# Patient Record
Sex: Female | Born: 1975 | Race: White | Hispanic: No | Marital: Married | Smoking: Never smoker
Health system: Southern US, Community
[De-identification: ages and names within clinical notes are randomized; demographics above are authoritative.]

## PROBLEM LIST (undated history)

## (undated) HISTORY — PX: ANKLE SURGERY: SHX546

---

## 2004-04-09 ENCOUNTER — Ambulatory Visit (HOSPITAL_COMMUNITY): Admission: RE | Admit: 2004-04-09 | Discharge: 2004-04-09 | Payer: Self-pay | Admitting: Obstetrics and Gynecology

## 2004-07-08 ENCOUNTER — Inpatient Hospital Stay (HOSPITAL_COMMUNITY): Admission: AD | Admit: 2004-07-08 | Discharge: 2004-07-11 | Payer: Self-pay | Admitting: Obstetrics and Gynecology

## 2004-08-13 ENCOUNTER — Other Ambulatory Visit: Admission: RE | Admit: 2004-08-13 | Discharge: 2004-08-13 | Payer: Self-pay | Admitting: Obstetrics and Gynecology

## 2008-04-25 ENCOUNTER — Inpatient Hospital Stay (HOSPITAL_COMMUNITY): Admission: AD | Admit: 2008-04-25 | Discharge: 2008-04-25 | Payer: Self-pay | Admitting: Obstetrics and Gynecology

## 2008-04-25 IMAGING — US US FETAL BPP W/O NONSTRESS
1 series · 12 of 12 positions shown · non-contrast
Comparison: none

OBSTETRICAL ULTRASOUND:
 This ultrasound exam was performed in the [HOSPITAL] Ultrasound Department.  The OB US report was generated in the AS system, and faxed to the ordering physician.  This report is also available in [REDACTED] PACS.

[Series 1: us fetal bpp w/o nonstress · 0.28mm/px · 12 acquisitions, 12 frames shown]
[im 1/12]
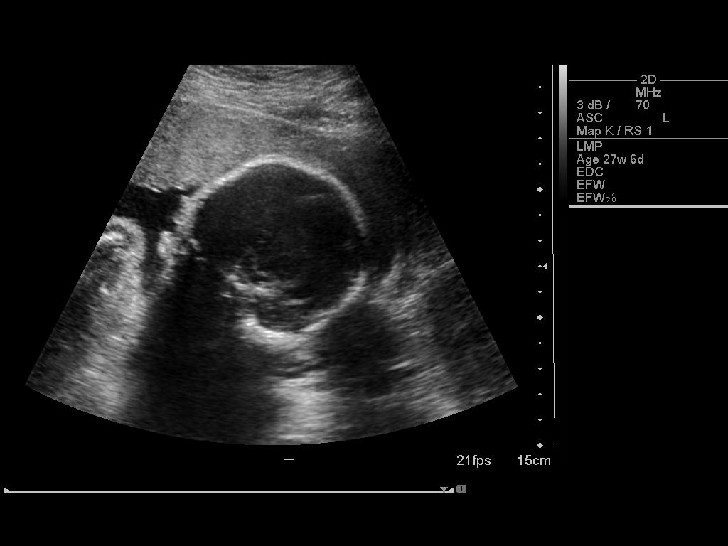
[im 2/12]
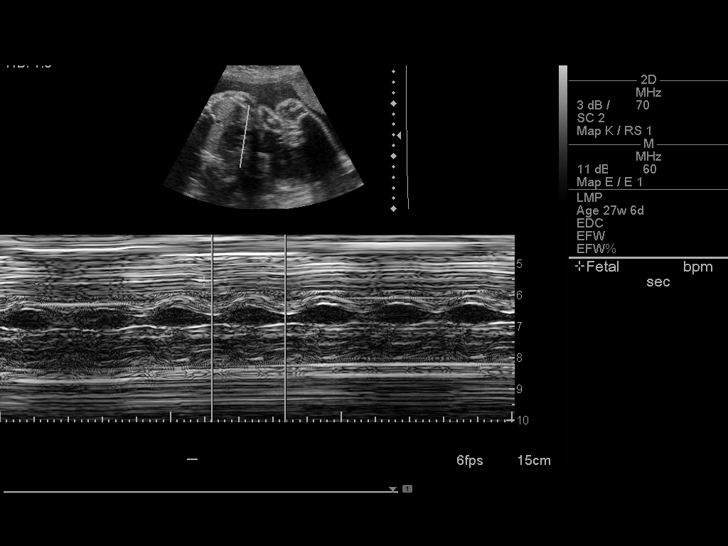
[im 3/12]
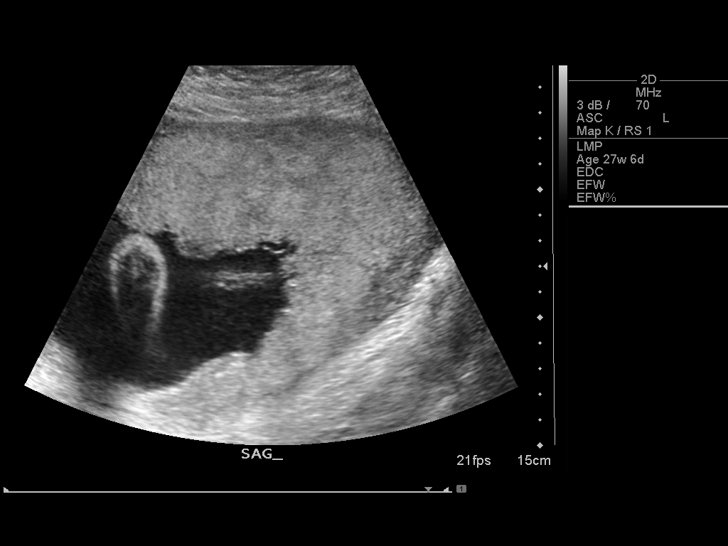
[im 4/12]
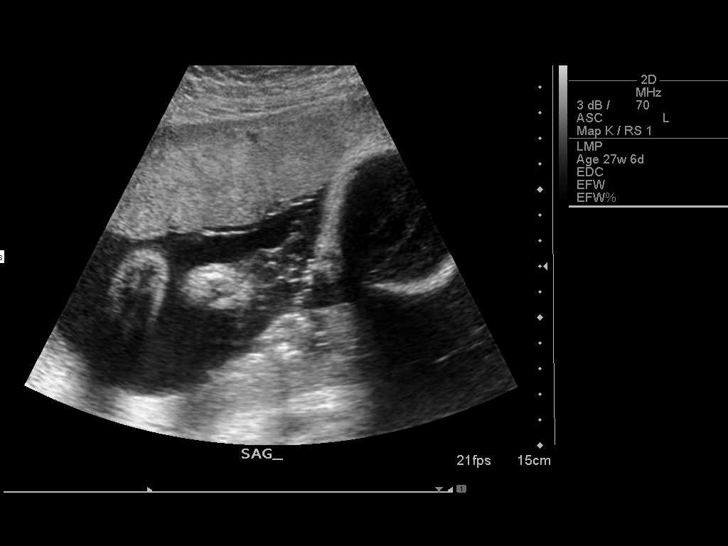
[im 5/12]
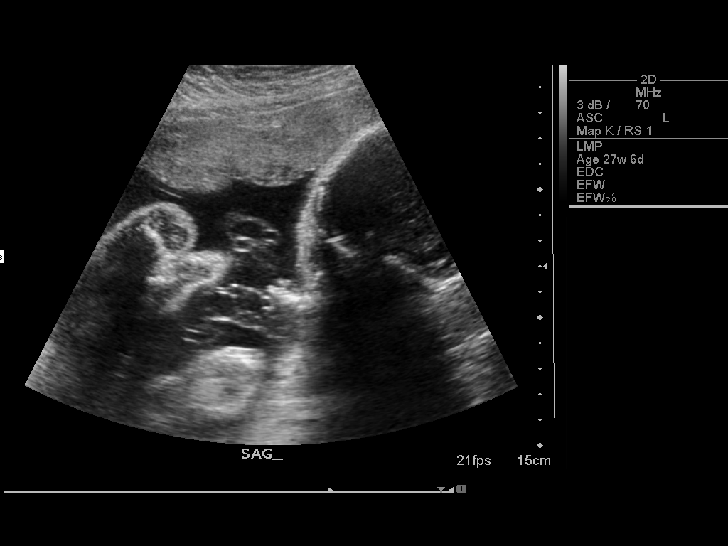
[im 6/12]
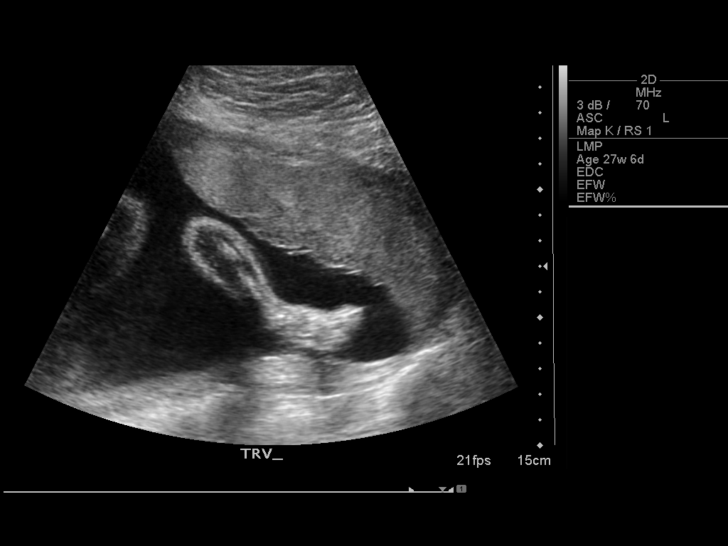
[im 7/12]
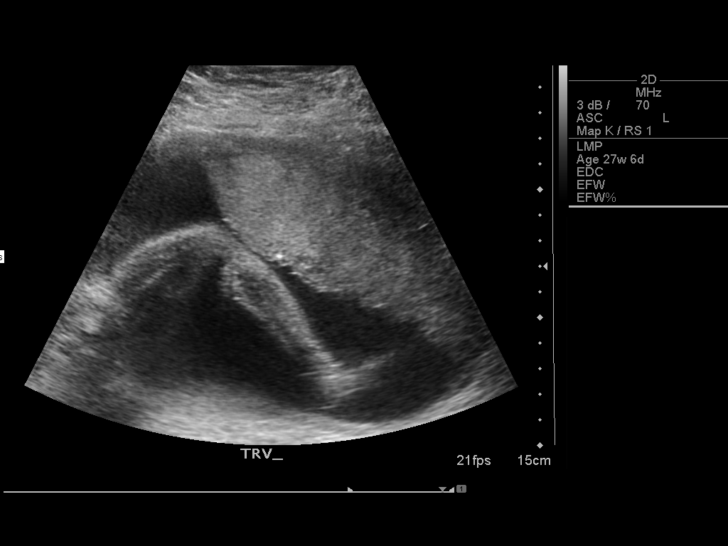
[im 8/12]
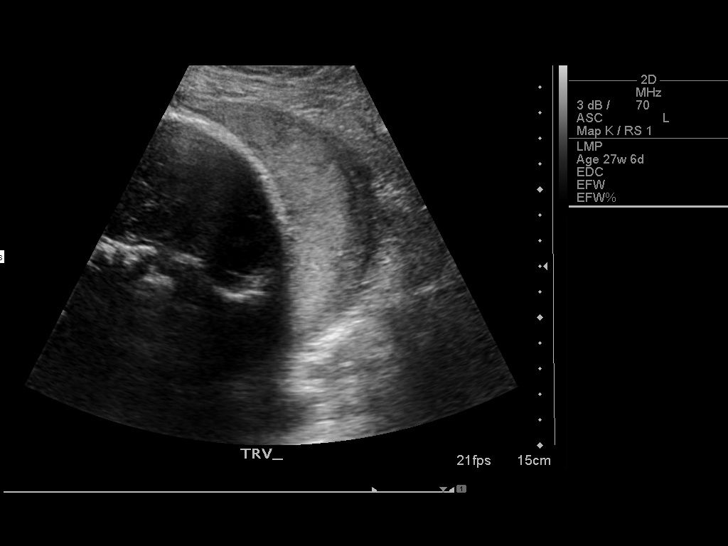
[im 9/12]
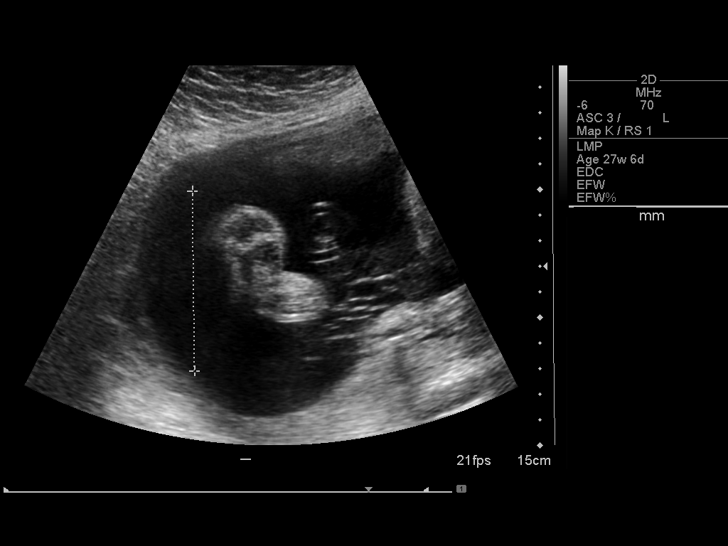
[im 10/12]
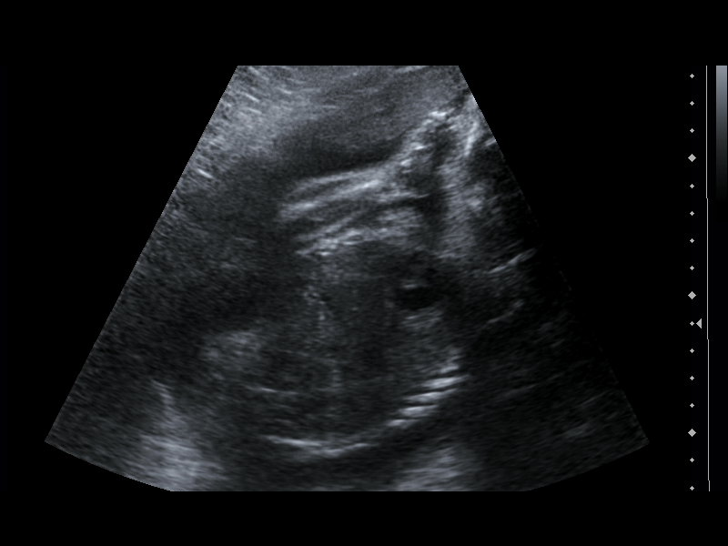
[im 11/12]
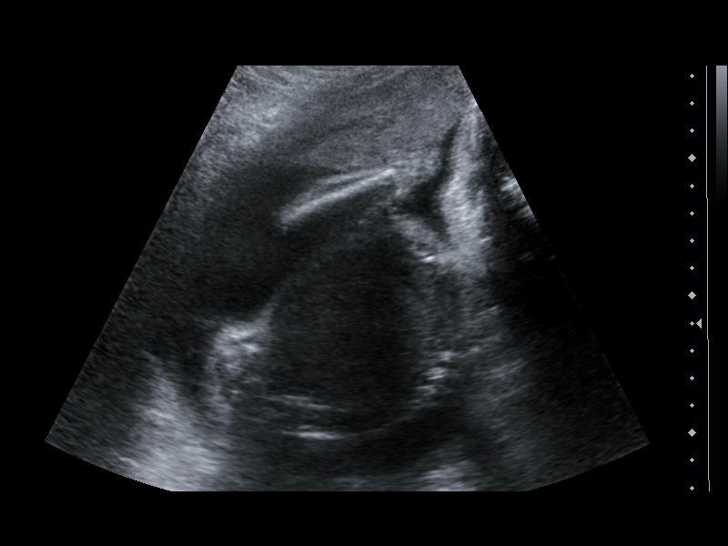
[im 12/12]
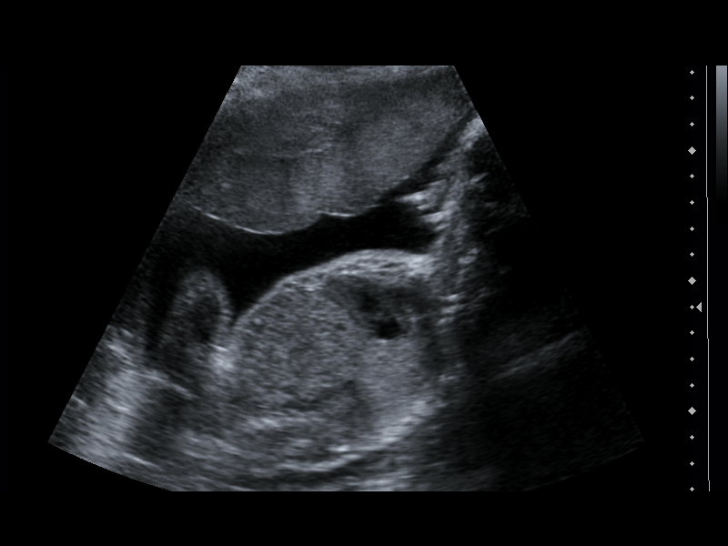

[12 of 12 positions shown; findings below may reference images not displayed]

IMPRESSION: See AS Obstetric US report.

## 2008-07-21 ENCOUNTER — Inpatient Hospital Stay (HOSPITAL_COMMUNITY): Admission: AD | Admit: 2008-07-21 | Discharge: 2008-07-23 | Payer: Self-pay | Admitting: Obstetrics and Gynecology

## 2008-07-21 ENCOUNTER — Encounter (INDEPENDENT_AMBULATORY_CARE_PROVIDER_SITE_OTHER): Payer: Self-pay | Admitting: Obstetrics and Gynecology

## 2011-04-26 NOTE — H&P (Signed)
NAMEROSAMARIA, Paige Roberts              ACCOUNT NO.:  [id number]   MEDICAL RECORD NO.:  [id number]          PATIENT TYPE:  INP   LOCATION:  9166                          FACILITY:  [facility]   PHYSICIAN:  Guy Sandifer. Henderson Cloud, M.D. DATE OF BIRTH:  06-Sep-1976   DATE OF ADMISSION:  07/21/2008  DATE OF DISCHARGE:                              HISTORY & PHYSICAL   CHIEF COMPLAINT:  Uterine contractions.   HISTORY OF PRESENT ILLNESS:  This patient is a 35 year old married white  female G3, P1, with an EDC of July 18, 2008, established by first-  trimester ultrasound, who is now 40-3/7 weeks.  Nonstress test today in  the office revealed three contractions and three decelerations with  those contractions.  There were accelerations as well.  Cervix is 2 cm  dilated, 60% effaced, soft, -2 in vertex.  She denies leaking of water,  vaginal bleeding, headache, vision changes, or epigastric pain.   PAST MEDICAL HISTORY/PAST SURGICAL HISTORY/FAMILY HISTORY/FAMILY  HISTORY/OBSTETRIC HISTORY/SOCIAL HISTORY:  See prenatal history and  physical.   MEDICATIONS:  Prenatal vitamins.   ALLERGIES:  PENICILLIN.   PHYSICAL EXAMINATION:  VITAL SIGNS:  Height 5 feet 6 inches, weight  164.8 pounds, blood pressure 122/78.  LUNGS:  Clear to auscultation.  HEART:  Regular rate and rhythm.  ABDOMEN:  Gravid.  Epigastrium nontender.  Cervix is 2 cm dilated, 60%  effaced, -2 station vertex.  EXTREMITIES:  Grossly within normal limits.  NEUROLOGIC:  Grossly within normal limits.   ASSESSMENT:  1. Intrauterine pregnancy at 40-3/7 weeks.  2. Fetal decelerations on office nonstress test.   PLAN:  Will admit to the hospital.  Continuous fetal monitoring, IV  fluids.  Anticipate probable artificial rupture of membranes.      Guy Sandifer Henderson Cloud, M.D.  Electronically Signed     JET/MEDQ  D:  07/21/2008  T:  07/21/2008  Job:  [id number]

## 2011-09-07 LAB — KLEIHAUER-BETKE STAIN
# Vials RhIg: 1
Quantitation Fetal Hemoglobin: 5

## 2011-09-07 LAB — CBC
HCT: 35.2 — ABNORMAL LOW
Platelets: 206
WBC: 6.8

## 2011-09-09 LAB — CBC
HCT: 35 — ABNORMAL LOW
Hemoglobin: 10.9 — ABNORMAL LOW
MCHC: 33.2
Platelets: 200
RBC: 3.69 — ABNORMAL LOW
WBC: 10.7 — ABNORMAL HIGH
WBC: 7.7

## 2011-09-09 LAB — RPR: RPR Ser Ql: NONREACTIVE

## 2020-01-20 ENCOUNTER — Other Ambulatory Visit: Payer: Self-pay

## 2023-04-08 ENCOUNTER — Emergency Department (HOSPITAL_BASED_OUTPATIENT_CLINIC_OR_DEPARTMENT_OTHER): Payer: BC Managed Care – PPO

## 2023-04-08 ENCOUNTER — Other Ambulatory Visit: Payer: Self-pay

## 2023-04-08 ENCOUNTER — Encounter (HOSPITAL_BASED_OUTPATIENT_CLINIC_OR_DEPARTMENT_OTHER): Payer: Self-pay | Admitting: *Deleted

## 2023-04-08 DIAGNOSIS — K449 Diaphragmatic hernia without obstruction or gangrene: Secondary | ICD-10-CM | POA: Diagnosis not present

## 2023-04-08 DIAGNOSIS — K440 Diaphragmatic hernia with obstruction, without gangrene: Secondary | ICD-10-CM | POA: Diagnosis not present

## 2023-04-08 DIAGNOSIS — K311 Adult hypertrophic pyloric stenosis: Secondary | ICD-10-CM | POA: Diagnosis present

## 2023-04-08 DIAGNOSIS — K219 Gastro-esophageal reflux disease without esophagitis: Secondary | ICD-10-CM | POA: Diagnosis present

## 2023-04-08 DIAGNOSIS — K3189 Other diseases of stomach and duodenum: Secondary | ICD-10-CM | POA: Diagnosis present

## 2023-04-08 LAB — COMPREHENSIVE METABOLIC PANEL
ALT: 11 U/L (ref 0–44)
AST: 12 U/L — ABNORMAL LOW (ref 15–41)
Albumin: 5.1 g/dL — ABNORMAL HIGH (ref 3.5–5.0)
Alkaline Phosphatase: 48 U/L (ref 38–126)
Anion gap: 20 — ABNORMAL HIGH (ref 5–15)
BUN: 15 mg/dL (ref 6–20)
CO2: 27 mmol/L (ref 22–32)
Calcium: 10.4 mg/dL — ABNORMAL HIGH (ref 8.9–10.3)
Chloride: 92 mmol/L — ABNORMAL LOW (ref 98–111)
Creatinine, Ser: 0.99 mg/dL (ref 0.44–1.00)
GFR, Estimated: >60 mL/min (ref >60)
Glucose, Bld: 148 mg/dL — ABNORMAL HIGH (ref 70–99)
Potassium: 3.3 mmol/L — ABNORMAL LOW (ref 3.5–5.1)
Sodium: 139 mmol/L (ref 135–145)
Total Bilirubin: 0.9 mg/dL (ref 0.3–1.2)
Total Protein: 8.5 g/dL — ABNORMAL HIGH (ref 6.5–8.1)

## 2023-04-08 LAB — CBC
HCT: 40.6 % (ref 36.0–46.0)
Hemoglobin: 12.6 g/dL (ref 12.0–15.0)
MCH: 22.7 pg — ABNORMAL LOW (ref 26.0–34.0)
MCHC: 31 g/dL (ref 30.0–36.0)
MCV: 73 fL — ABNORMAL LOW (ref 80.0–100.0)
Platelets: 447 10*3/uL — ABNORMAL HIGH (ref 150–400)
RBC: 5.56 MIL/uL — ABNORMAL HIGH (ref 3.87–5.11)
RDW: 15.9 % — ABNORMAL HIGH (ref 11.5–15.5)
WBC: 13.9 10*3/uL — ABNORMAL HIGH (ref 4.0–10.5)
nRBC: 0 % (ref 0.0–0.2)

## 2023-04-08 LAB — PREGNANCY, URINE: Preg Test, Ur: NEGATIVE

## 2023-04-08 LAB — LIPASE, BLOOD: Lipase: 14 U/L (ref 11–51)

## 2023-04-08 NOTE — ED Triage Notes (Signed)
Pt has been vomiting for the last 30 hours.  Pt has had a difficult time holding anything down.  Pt reports that she has had heartburn since Tuesday.  Pt denies any fever or chills.  Pt has had gas, acid reflux, vomiting

## 2023-04-09 ENCOUNTER — Emergency Department (HOSPITAL_BASED_OUTPATIENT_CLINIC_OR_DEPARTMENT_OTHER): Payer: BC Managed Care – PPO

## 2023-04-09 ENCOUNTER — Inpatient Hospital Stay (HOSPITAL_BASED_OUTPATIENT_CLINIC_OR_DEPARTMENT_OTHER)
Admission: EM | Admit: 2023-04-09 | Discharge: 2023-04-12 | DRG: 327 | Disposition: A | Discharge status: Home / Self Care | Payer: BC Managed Care – PPO | Attending: General Surgery | Admitting: General Surgery

## 2023-04-09 ENCOUNTER — Inpatient Hospital Stay (HOSPITAL_COMMUNITY): Payer: BC Managed Care – PPO

## 2023-04-09 DIAGNOSIS — K311 Adult hypertrophic pyloric stenosis: Secondary | ICD-10-CM | POA: Diagnosis present

## 2023-04-09 DIAGNOSIS — K56609 Unspecified intestinal obstruction, unspecified as to partial versus complete obstruction: Secondary | ICD-10-CM

## 2023-04-09 DIAGNOSIS — K440 Diaphragmatic hernia with obstruction, without gangrene: Secondary | ICD-10-CM | POA: Diagnosis present

## 2023-04-09 DIAGNOSIS — K3189 Other diseases of stomach and duodenum: Secondary | ICD-10-CM | POA: Diagnosis present

## 2023-04-09 DIAGNOSIS — K449 Diaphragmatic hernia without obstruction or gangrene: Secondary | ICD-10-CM | POA: Diagnosis present

## 2023-04-09 DIAGNOSIS — K219 Gastro-esophageal reflux disease without esophagitis: Secondary | ICD-10-CM | POA: Diagnosis present

## 2023-04-09 LAB — URINALYSIS, ROUTINE W REFLEX MICROSCOPIC
Glucose, UA: NEGATIVE mg/dL
Ketones, ur: >80 mg/dL — AB
Leukocytes,Ua: NEGATIVE
Nitrite: NEGATIVE
Protein, ur: 100 mg/dL — AB
Specific Gravity, Urine: 1.037 — ABNORMAL HIGH (ref 1.005–1.030)
pH: 6 (ref 5.0–8.0)

## 2023-04-09 LAB — CREATININE, SERUM
Creatinine, Ser: 0.85 mg/dL (ref 0.44–1.00)
GFR, Estimated: >60 mL/min (ref >60)

## 2023-04-09 LAB — HIV ANTIBODY (ROUTINE TESTING W REFLEX): HIV Screen 4th Generation wRfx: NONREACTIVE

## 2023-04-09 MED ORDER — DROPERIDOL 2.5 MG/ML IJ SOLN
1.2500 mg | Freq: Once | INTRAMUSCULAR | Status: AC
  Administered 2023-04-09: 1.25 mg via INTRAVENOUS
  Filled 2023-04-09: qty 2

## 2023-04-09 MED ORDER — PANTOPRAZOLE SODIUM 40 MG IV SOLR
40.0000 mg | Freq: Two times a day (BID) | INTRAVENOUS | Status: DC
  Administered 2023-04-09 – 2023-04-12 (×6): 40 mg via INTRAVENOUS
  Filled 2023-04-09 (×6): qty 10

## 2023-04-09 MED ORDER — LIDOCAINE HCL URETHRAL/MUCOSAL 2 % EX GEL
1.0000 | Freq: Once | CUTANEOUS | Status: AC
  Administered 2023-04-09: 1
  Filled 2023-04-09: qty 11

## 2023-04-09 MED ORDER — ONDANSETRON HCL 4 MG/2ML IJ SOLN: 4.0000 mg | Freq: Four times a day (QID) | INTRAMUSCULAR | Status: DC | PRN

## 2023-04-09 MED ORDER — KCL IN DEXTROSE-NACL 20-5-0.45 MEQ/L-%-% IV SOLN
INTRAVENOUS | Status: DC
  Filled 2023-04-09 (×7): qty 1000

## 2023-04-09 MED ORDER — METOPROLOL TARTRATE 5 MG/5ML IV SOLN: 5.0000 mg | Freq: Four times a day (QID) | INTRAVENOUS | Status: DC | PRN

## 2023-04-09 MED ORDER — DIPHENHYDRAMINE HCL 50 MG/ML IJ SOLN: 25.0000 mg | Freq: Four times a day (QID) | INTRAMUSCULAR | Status: DC | PRN

## 2023-04-09 MED ORDER — DIPHENHYDRAMINE HCL 25 MG PO CAPS: 25.0000 mg | ORAL_CAPSULE | Freq: Four times a day (QID) | ORAL | Status: DC | PRN

## 2023-04-09 MED ORDER — BUTAMBEN-TETRACAINE-BENZOCAINE 2-2-14 % EX AERO: 1.0000 | INHALATION_SPRAY | Freq: Once | CUTANEOUS | Status: DC

## 2023-04-09 MED ORDER — SODIUM CHLORIDE 0.9 % IV BOLUS
1000.0000 mL | Freq: Once | INTRAVENOUS | Status: AC
  Administered 2023-04-09: 1000 mL via INTRAVENOUS

## 2023-04-09 MED ORDER — HYDRALAZINE HCL 20 MG/ML IJ SOLN: 10.0000 mg | INTRAMUSCULAR | Status: DC | PRN

## 2023-04-09 MED ORDER — MORPHINE SULFATE (PF) 2 MG/ML IV SOLN: 1.0000 mg | INTRAVENOUS | Status: DC | PRN

## 2023-04-09 MED ORDER — ACETAMINOPHEN 10 MG/ML IV SOLN: 1000.0000 mg | Freq: Four times a day (QID) | INTRAVENOUS | Status: AC | PRN

## 2023-04-09 MED ORDER — ONDANSETRON 4 MG PO TBDP: 4.0000 mg | ORAL_TABLET | Freq: Four times a day (QID) | ORAL | Status: DC | PRN

## 2023-04-09 MED ORDER — POTASSIUM CHLORIDE 10 MEQ/100ML IV SOLN
10.0000 meq | INTRAVENOUS | Status: AC
  Administered 2023-04-09 (×3): 10 meq via INTRAVENOUS
  Filled 2023-04-09 (×3): qty 100

## 2023-04-09 MED ORDER — ENOXAPARIN SODIUM 40 MG/0.4ML IJ SOSY
40.0000 mg | PREFILLED_SYRINGE | INTRAMUSCULAR | Status: DC
  Administered 2023-04-09 – 2023-04-10 (×2): 40 mg via SUBCUTANEOUS
  Filled 2023-04-09 (×2): qty 0.4

## 2023-04-09 MED ORDER — DIATRIZOATE MEGLUMINE & SODIUM 66-10 % PO SOLN
90.0000 mL | Freq: Once | ORAL | Status: AC
  Administered 2023-04-09: 90 mL via NASOGASTRIC
  Filled 2023-04-09: qty 90

## 2023-04-09 NOTE — ED Provider Notes (Signed)
[place] EMERGENCY DEPARTMENT AT [facility] Provider Note   CSN: [id number] Arrival date & time: 04/08/23  2236     History  Chief Complaint  Patient presents with   Emesis    Paige Roberts is a 47 y.o. female.  The history is provided by the patient.  Emesis Severity:  Severe Duration:  2 days Timing:  Intermittent Quality:  Stomach contents Progression:  Unchanged Chronicity:  New Recent urination:  Normal Context: not post-tussive   Relieved by:  Nothing Worsened by:  Nothing Associated symptoms: abdominal pain   Associated symptoms: no diarrhea and no fever   Risk factors: no prior abdominal surgery   Patient with GERD like symptoms since Tuesday and vomiting since Thursday into Friday.  Last ingestion was watermelon Friday am.     History reviewed. No pertinent past medical history.   Home Medications Prior to Admission medications   Not on File      Allergies    Penicillins    Review of Systems   Review of Systems  Constitutional:  Negative for fever.  HENT:  Negative for congestion.   Eyes:  Negative for redness.  Respiratory:  Negative for wheezing and stridor.   Cardiovascular:  Negative for chest pain.  Gastrointestinal:  Positive for abdominal pain, nausea and vomiting. Negative for constipation and diarrhea.  Genitourinary:  Negative for dysuria.  All other systems reviewed and are negative.   Physical Exam Updated Vital Signs BP 103/80   Pulse 89   Temp (!) 97.5 F (36.4 C) (Oral)   Resp 18   Wt 74.8 kg   LMP 03/25/2023   SpO2 95%  Physical Exam Vitals and nursing note reviewed.  Constitutional:      General: She is not in acute distress.    Appearance: Normal appearance. She is well-developed.  HENT:     Head: Normocephalic and atraumatic.     Nose: Nose normal.  Eyes:     Pupils: Pupils are equal, round, and reactive to light.  Cardiovascular:     Rate and Rhythm: Normal rate and regular rhythm.     Pulses:  Normal pulses.     Heart sounds: Normal heart sounds.  Pulmonary:     Effort: Pulmonary effort is normal. No respiratory distress.     Breath sounds: Normal breath sounds.  Abdominal:     General: There is distension.     Tenderness: There is no abdominal tenderness. There is no guarding or rebound.     Comments: Decreased BS  Genitourinary:    Vagina: No vaginal discharge.  Musculoskeletal:        General: Normal range of motion.     Cervical back: Neck supple.  Skin:    General: Skin is warm and dry.     Capillary Refill: Capillary refill takes less than 2 seconds.     Findings: No erythema or rash.  Neurological:     General: No focal deficit present.     Mental Status: She is alert and oriented to person, place, and time.     Deep Tendon Reflexes: Reflexes normal.  Psychiatric:        Mood and Affect: Mood normal.        Behavior: Behavior normal.     ED Results / Procedures / Treatments   Labs (all labs ordered are listed, but only abnormal results are displayed) Results for orders placed or performed during the hospital encounter of 04/09/23  Lipase, blood  Result Value  Ref Range   Lipase 14 11 - 51 U/L  Comprehensive metabolic panel  Result Value Ref Range   Sodium 139 135 - 145 mmol/L   Potassium 3.3 (L) 3.5 - 5.1 mmol/L   Chloride 92 (L) 98 - 111 mmol/L   CO2 27 22 - 32 mmol/L   Glucose, Bld 148 (H) 70 - 99 mg/dL   BUN 15 6 - 20 mg/dL   Creatinine, Ser 8.29 0.44 - 1.00 mg/dL   Calcium 56.2 (H) 8.9 - 10.3 mg/dL   Total Protein 8.5 (H) 6.5 - 8.1 g/dL   Albumin 5.1 (H) 3.5 - 5.0 g/dL   AST 12 (L) 15 - 41 U/L   ALT 11 0 - 44 U/L   Alkaline Phosphatase 48 38 - 126 U/L   Total Bilirubin 0.9 0.3 - 1.2 mg/dL   GFR, Estimated >13 >08 mL/min   Anion gap 20 (H) 5 - 15  CBC  Result Value Ref Range   WBC 13.9 (H) 4.0 - 10.5 K/uL   RBC 5.56 (H) 3.87 - 5.11 MIL/uL   Hemoglobin 12.6 12.0 - 15.0 g/dL   HCT 65.7 84.6 - 96.2 %   MCV 73.0 (L) 80.0 - 100.0 fL   MCH  22.7 (L) 26.0 - 34.0 pg   MCHC 31.0 30.0 - 36.0 g/dL   RDW 95.2 (H) 84.1 - 32.4 %   Platelets 447 (H) 150 - 400 K/uL   nRBC 0.0 0.0 - 0.2 %  Urinalysis, Routine w reflex microscopic -Urine, Clean Catch  Result Value Ref Range   Color, Urine YELLOW YELLOW   APPearance CLEAR CLEAR   Specific Gravity, Urine 1.037 (H) 1.005 - 1.030   pH 6.0 5.0 - 8.0   Glucose, UA NEGATIVE NEGATIVE mg/dL   Hgb urine dipstick SMALL (A) NEGATIVE   Bilirubin Urine SMALL (A) NEGATIVE   Ketones, ur >80 (A) NEGATIVE mg/dL   Protein, ur 401 (A) NEGATIVE mg/dL   Nitrite NEGATIVE NEGATIVE   Leukocytes,Ua NEGATIVE NEGATIVE   RBC / HPF 6-10 0 - 5 RBC/hpf   WBC, UA 0-5 0 - 5 WBC/hpf   Bacteria, UA RARE (A) NONE SEEN   Squamous Epithelial / HPF 11-20 0 - 5 /HPF   Mucus PRESENT    Hyaline Casts, UA PRESENT   Pregnancy, urine  Result Value Ref Range   Preg Test, Ur NEGATIVE NEGATIVE   DG Abd Portable 1 View  Result Date: 04/09/2023 CLINICAL DATA:  Check gastric catheter placement EXAM: PORTABLE ABDOMEN - 1 VIEW COMPARISON:  CT from the previous day. FINDINGS: Gastric catheter is noted coiled within the stomach. Persistent fluid-filled distal gastric hernia is noted. No free air is noted. IMPRESSION: Gastric catheter is coiled within the proximal stomach. Persistent herniation of the distal aspect of the stomach is noted. Electronically Signed   By: Alcide Clever M.D.   On: 04/09/2023 02:43   CT Renal Stone Study  Result Date: 04/09/2023 CLINICAL DATA:  Vomiting. EXAM: CT ABDOMEN AND PELVIS WITHOUT CONTRAST TECHNIQUE: Multidetector CT imaging of the abdomen and pelvis was performed following the standard protocol without IV contrast. RADIATION DOSE REDUCTION: This exam was performed according to the departmental dose-optimization program which includes automated exposure control, adjustment of the mA and/or kV according to patient size and/or use of iterative reconstruction technique. COMPARISON:  None Available.  FINDINGS: Lower chest: There is elevation of the left hemidiaphragm. No acute abnormalities are identified. Hepatobiliary: No focal liver abnormality is seen. No gallstones, gallbladder wall thickening, or  biliary dilatation. Pancreas: Unremarkable. No pancreatic ductal dilatation or surrounding inflammatory changes. Spleen: Normal in size without focal abnormality. Adrenals/Urinary Tract: Adrenal glands are unremarkable. Kidneys are normal, without renal calculi, focal lesion, or hydronephrosis. The urinary bladder is empty and subsequently limited in evaluation. Stomach/Bowel: There is a large gastric hernia which contains a large portion of the body of the stomach. The stomach is markedly distended, with the remainder of the gastric body and gastric fundus seen within the left upper quadrant. An abrupt transition zone is seen near the level of the gastric antrum which is positioned within the inferior medial aspect of the previously noted gastric hernia (coronal reformatted images 46 through 56, CT series 5). The small bowel loops distal to this level are completely decompressed. A mild amount of air and stool are seen throughout the large bowel. The appendix is normal in appearance. Vascular/Lymphatic: No significant vascular findings are present. No enlarged abdominal or pelvic lymph nodes. Reproductive: The uterus and right adnexa are unremarkable. A 1.8 cm diameter simple cyst is seen within the left adnexa. Other: No abdominal wall hernia or abnormality. No abdominopelvic ascites. Musculoskeletal: No acute or significant osseous findings. IMPRESSION: 1. Very large gastric hernia, as described above, with subsequent high-grade gastric outlet obstruction. 2. Simple left adnexal cyst, likely ovarian in origin. No follow-up imaging is recommended. This recommendation follows ACR consensus guidelines: White Paper of the ACR Incidental Findings Committee II on Adnexal Findings. J Am Coll Radiol [phone].  Electronically Signed   By: Aram Candela M.D.   On: 04/09/2023 00:11    EKG EKG Interpretation  Date/Time:  Saturday Damond Borchers 27 2024 23:25:58 EDT Ventricular Rate:  112 PR Interval:  138 QRS Duration: 74 QT Interval:  316 QTC Calculation: 431 R Axis:   -64 Text Interpretation: Sinus tachycardia with occasional Premature ventricular complexes Left anterior fascicular block Confirmed by Nicanor Alcon, Ailine Hefferan ([id number]) on 04/08/2023 11:27:59 PM  Radiology DG Abd Portable 1 View  Result Date: 04/09/2023 CLINICAL DATA:  Check gastric catheter placement EXAM: PORTABLE ABDOMEN - 1 VIEW COMPARISON:  CT from the previous day. FINDINGS: Gastric catheter is noted coiled within the stomach. Persistent fluid-filled distal gastric hernia is noted. No free air is noted. IMPRESSION: Gastric catheter is coiled within the proximal stomach. Persistent herniation of the distal aspect of the stomach is noted. Electronically Signed   By: Alcide Clever M.D.   On: 04/09/2023 02:43   CT Renal Stone Study  Result Date: 04/09/2023 CLINICAL DATA:  Vomiting. EXAM: CT ABDOMEN AND PELVIS WITHOUT CONTRAST TECHNIQUE: Multidetector CT imaging of the abdomen and pelvis was performed following the standard protocol without IV contrast. RADIATION DOSE REDUCTION: This exam was performed according to the departmental dose-optimization program which includes automated exposure control, adjustment of the mA and/or kV according to patient size and/or use of iterative reconstruction technique. COMPARISON:  None Available. FINDINGS: Lower chest: There is elevation of the left hemidiaphragm. No acute abnormalities are identified. Hepatobiliary: No focal liver abnormality is seen. No gallstones, gallbladder wall thickening, or biliary dilatation. Pancreas: Unremarkable. No pancreatic ductal dilatation or surrounding inflammatory changes. Spleen: Normal in size without focal abnormality. Adrenals/Urinary Tract: Adrenal glands are unremarkable.  Kidneys are normal, without renal calculi, focal lesion, or hydronephrosis. The urinary bladder is empty and subsequently limited in evaluation. Stomach/Bowel: There is a large gastric hernia which contains a large portion of the body of the stomach. The stomach is markedly distended, with the remainder of the gastric body and gastric fundus seen within  the left upper quadrant. An abrupt transition zone is seen near the level of the gastric antrum which is positioned within the inferior medial aspect of the previously noted gastric hernia (coronal reformatted images 46 through 56, CT series 5). The small bowel loops distal to this level are completely decompressed. A mild amount of air and stool are seen throughout the large bowel. The appendix is normal in appearance. Vascular/Lymphatic: No significant vascular findings are present. No enlarged abdominal or pelvic lymph nodes. Reproductive: The uterus and right adnexa are unremarkable. A 1.8 cm diameter simple cyst is seen within the left adnexa. Other: No abdominal wall hernia or abnormality. No abdominopelvic ascites. Musculoskeletal: No acute or significant osseous findings. IMPRESSION: 1. Very large gastric hernia, as described above, with subsequent high-grade gastric outlet obstruction. 2. Simple left adnexal cyst, likely ovarian in origin. No follow-up imaging is recommended. This recommendation follows ACR consensus guidelines: White Paper of the ACR Incidental Findings Committee II on Adnexal Findings. J Am Coll Radiol [phone]. Electronically Signed   By: Aram Candela M.D.   On: 04/09/2023 00:11    Procedures Procedures    Medications Ordered in ED Medications  potassium chloride 10 mEq in 100 mL IVPB (10 mEq Intravenous New Bag/Given 04/09/23 0350)  sodium chloride 0.9 % bolus 1,000 mL (0 mLs Intravenous Stopped 04/09/23 0239)  droperidol (INAPSINE) 2.5 MG/ML injection 1.25 mg (1.25 mg Intravenous Given 04/09/23 0041)  lidocaine  (XYLOCAINE) 2 % jelly 1 Application (1 Application Other Given 04/09/23 0224)    ED Course/ Medical Decision Making/ A&P                             Medical Decision Making Patient with vomiting and pain for 2 days   Amount and/or Complexity of Data Reviewed Independent Historian: spouse    Details: See above  External Data Reviewed: notes.    Details: Previous notes reviewed  Labs: ordered.    Details: Pregnancy is negative, urine is without UTI. Lipase normal 14, 13.9 elevated white count, hemoglobin 12.1 normal, elevated platelet count 447.  Normal sodium 139, potassium slight low 3.3 normal creatinine .99, normal LFTs Radiology: ordered and independent interpretation performed.    Details: Large hiatal hernia by me on CT  Discussion of management or test interpretation with external provider(s): Dr. Sophronia Simas please put in NG tube and surgery will admit.  We stayed in contact regarding care of patient via chat.    Risk Prescription drug management. Decision regarding hospitalization. Minor surgery with no identified risk factors. Risk Details: NG tube placed with large output confirmed on KUB  Critical Care Total time providing critical care: 30 minutes (Bedside care consults reassessment and potential for serious outcomes )    Final Clinical Impression(s) / ED Diagnoses Final diagnoses:  None   The patient appears reasonably stabilized for admission considering the current resources, flow, and capabilities available in the ED at this time, and I doubt any other [facility] requiring further screening and/or treatment in the ED prior to admission.  Rx / DC Orders ED Discharge Orders     None         Jayme Mednick, MD 04/09/23 [phone]

## 2023-04-09 NOTE — ED Notes (Signed)
I attempt to call [facility] at [facility]--no answer.

## 2023-04-09 NOTE — ED Notes (Signed)
NG tube placed.  Pt tolerated this well at this time.  of brown, tan, clear stomach content removed.  Placed on LIWS

## 2023-04-09 NOTE — ED Notes (Signed)
This RN attempted NG insertion x2, pt pulled out tube x2. Pt declined further attempts. Paulmbo MD made aware

## 2023-04-09 NOTE — Plan of Care (Signed)
  Problem: Activity: Goal: Risk for activity intolerance will decrease Outcome: Progressing   Problem: Safety: Goal: Ability to remain free from injury will improve Outcome: Progressing   Problem: Nutrition: Goal: Adequate nutrition will be maintained Outcome: Not Progressing

## 2023-04-09 NOTE — H&P (Signed)
Admitting Physician: Hyman Hopes Nuala Chiles  Service: General surgery  CC: nausea vomiting  Subjective   HPI: Paige Roberts is an 47 y.o. 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  Her symptoms really bad started on Friday.  They are progressively worse with a lot of nausea and pain without being able to decompress everything via vomiting.  [facility] and [facility] to [facility] for surgery admission.  A nasogastric tube was able to be placed with some difficulty at [facility] and decompressed about 2 L out of her stomach.  Thinking back, she has had a chronic cough, and some dysphagia and acid reflux symptoms over the years that never really been fully understood.  She has never been told she has a hiatal hernia before.   History reviewed. No pertinent past medical history.  Past Surgical History:  Procedure Laterality Date   ANKLE SURGERY      History reviewed. No pertinent family history.  Social:  reports that she has never smoked. She has never used smokeless tobacco. She reports current alcohol use. She reports that she does not use drugs.  Allergies:  Allergies  Allergen Reactions   Penicillins Rash    Medications: No current outpatient medications  ROS - all of the below systems have been reviewed with the patient and positives are indicated with bold text General: chills, fever or night sweats Eyes: blurry vision or double vision ENT: epistaxis or sore throat Allergy/Immunology: itchy/watery eyes or nasal congestion Hematologic/Lymphatic: bleeding problems, blood clots or swollen lymph nodes Endocrine: temperature intolerance or unexpected weight changes Breast: new or changing breast lumps or nipple discharge Resp: cough, shortness of breath, or wheezing CV: chest pain or dyspnea on exertion GI: as per HPI GU: dysuria, trouble voiding,  or hematuria MSK: joint pain or joint stiffness Neuro: TIA or stroke symptoms Derm: pruritus and skin lesion changes Psych: anxiety and depression  Objective   PE Blood pressure 119/82, pulse 71, temperature 98.2 F (36.8 C), temperature source Axillary, resp. rate 18, weight 74.8 kg, last menstrual period 03/25/2023, SpO2 93 %. Constitutional: NAD; conversant; no deformities Eyes: Moist conjunctiva; no lid lag; anicteric; PERRL Neck: Trachea midline; no thyromegaly Lungs: Normal respiratory effort; no tactile fremitus CV: RRR; no palpable thrills; no pitting edema GI: Abd soft, nontender, nondistended.  NG tube decompressing.; no palpable hepatosplenomegaly MSK: Normal range of motion of extremities; no clubbing/cyanosis Psychiatric: Appropriate affect; alert and oriented x3 Lymphatic: No palpable cervical or axillary lymphadenopathy  Results for orders placed or performed during the hospital encounter of 04/09/23 (from the past 24 hour(s))  Lipase, blood     Status: None   Collection Time: 04/08/23 11:00 PM  Result Value Ref Range   Lipase 14 11 - 51 U/L  Comprehensive metabolic panel     Status: Abnormal   Collection Time: 04/08/23 11:00 PM  Result Value Ref Range   Sodium 139 135 - 145 mmol/L   Potassium 3.3 (L) 3.5 - 5.1 mmol/L   Chloride 92 (L) 98 - 111 mmol/L   CO2 27 22 - 32 mmol/L   Glucose, Bld 148 (H) 70 - 99 mg/dL   BUN 15 6 - 20 mg/dL   Creatinine, Ser 4.09 0.44 - 1.00 mg/dL   Calcium 81.1 (H) 8.9 - 10.3 mg/dL   Total Protein 8.5 (H) 6.5 - 8.1 g/dL   Albumin 5.1 (H) 3.5 -  5.0 g/dL   AST 12 (L) 15 - 41 U/L   ALT 11 0 - 44 U/L   Alkaline Phosphatase 48 38 - 126 U/L   Total Bilirubin 0.9 0.3 - 1.2 mg/dL   GFR, Estimated >16 >10 mL/min   Anion gap 20 (H) 5 - 15  CBC     Status: Abnormal   Collection Time: 04/08/23 11:00 PM  Result Value Ref Range   WBC 13.9 (H) 4.0 - 10.5 K/uL   RBC 5.56 (H) 3.87 - 5.11 MIL/uL   Hemoglobin 12.6 12.0 - 15.0 g/dL   HCT 96.0  45.4 - 09.8 %   MCV 73.0 (L) 80.0 - 100.0 fL   MCH 22.7 (L) 26.0 - 34.0 pg   MCHC 31.0 30.0 - 36.0 g/dL   RDW 11.9 (H) 14.7 - 82.9 %   Platelets 447 (H) 150 - 400 K/uL   nRBC 0.0 0.0 - 0.2 %  Urinalysis, Routine w reflex microscopic -Urine, Clean Catch     Status: Abnormal   Collection Time: 04/08/23 11:03 PM  Result Value Ref Range   Color, Urine YELLOW YELLOW   APPearance CLEAR CLEAR   Specific Gravity, Urine 1.037 (H) 1.005 - 1.030   pH 6.0 5.0 - 8.0   Glucose, UA NEGATIVE NEGATIVE mg/dL   Hgb urine dipstick SMALL (A) NEGATIVE   Bilirubin Urine SMALL (A) NEGATIVE   Ketones, ur >80 (A) NEGATIVE mg/dL   Protein, ur 562 (A) NEGATIVE mg/dL   Nitrite NEGATIVE NEGATIVE   Leukocytes,Ua NEGATIVE NEGATIVE   RBC / HPF 6-10 0 - 5 RBC/hpf   WBC, UA 0-5 0 - 5 WBC/hpf   Bacteria, UA RARE (A) NONE SEEN   Squamous Epithelial / HPF 11-20 0 - 5 /HPF   Mucus PRESENT    Hyaline Casts, UA PRESENT   Pregnancy, urine     Status: None   Collection Time: 04/08/23 11:03 PM  Result Value Ref Range   Preg Test, Ur NEGATIVE NEGATIVE  HIV Antibody (routine testing w rflx)     Status: None   Collection Time: 04/09/23 12:02 PM  Result Value Ref Range   HIV Screen 4th Generation wRfx Non Reactive Non Reactive  Creatinine, serum     Status: None   Collection Time: 04/09/23 12:02 PM  Result Value Ref Range   Creatinine, Ser 0.85 0.44 - 1.00 mg/dL   GFR, Estimated >13 >08 mL/min     Imaging Orders         CT Renal Stone Study         DG Abd Portable 1 View         DG Abd Portable 1V-Small Bowel Obstruction Protocol-initial, 8 hr delay      Assessment and Plan   Paige Roberts is an 47 y.o. female with hiatal hernia and gastric volvulus.  Thankfully, her symptoms have improved and she is feeling much better after nasogastric tube decompression.  Will give her some contrast to see if the stomach is emptying.  If it is emptying, we could consider pulling the NG tube prior to surgery.  If her stomach  is not emptying, she will require an NG tube until surgery.  Either way she may benefit from having this hernia repaired while she is here in the hospital.  My partner, Dr. Derrell Lolling, may be able to take care of this problem while she is on the emergency general surgery service this week.  For now, we will give her stomach some time  to decompress and follow-up on her contrast study.    ICD-10-CM   1. Hiatal hernia  K44.9     2. Gastric outflow obstruction  K31.1     3. Small bowel obstruction (HCC)  K56.609 DG Abd Portable 1V-Small Bowel Obstruction Protocol-initial, 8 hr delay    DG Abd Portable 1V-Small Bowel Obstruction Protocol-initial, 8 hr delay       Quentin Ore, MD  [facility] Use [email or web address] to contact on call provider  New Patient Billing: [id number] - High MDM

## 2023-04-10 ENCOUNTER — Inpatient Hospital Stay (HOSPITAL_COMMUNITY): Payer: BC Managed Care – PPO

## 2023-04-10 ENCOUNTER — Encounter (HOSPITAL_COMMUNITY): Payer: Self-pay

## 2023-04-10 LAB — BASIC METABOLIC PANEL
Anion gap: 7 (ref 5–15)
BUN: 9 mg/dL (ref 6–20)
CO2: 28 mmol/L (ref 22–32)
Calcium: 8.6 mg/dL — ABNORMAL LOW (ref 8.9–10.3)
Chloride: 102 mmol/L (ref 98–111)
Creatinine, Ser: 0.87 mg/dL (ref 0.44–1.00)
GFR, Estimated: >60 mL/min (ref >60)
Glucose, Bld: 109 mg/dL — ABNORMAL HIGH (ref 70–99)
Potassium: 3.2 mmol/L — ABNORMAL LOW (ref 3.5–5.1)
Sodium: 137 mmol/L (ref 135–145)

## 2023-04-10 LAB — CBC
HCT: 33.9 % — ABNORMAL LOW (ref 36.0–46.0)
Hemoglobin: 10.1 g/dL — ABNORMAL LOW (ref 12.0–15.0)
MCH: 22.7 pg — ABNORMAL LOW (ref 26.0–34.0)
MCHC: 29.8 g/dL — ABNORMAL LOW (ref 30.0–36.0)
MCV: 76.2 fL — ABNORMAL LOW (ref 80.0–100.0)
Platelets: 259 10*3/uL (ref 150–400)
RBC: 4.45 MIL/uL (ref 3.87–5.11)
RDW: 16 % — ABNORMAL HIGH (ref 11.5–15.5)
WBC: 6.9 10*3/uL (ref 4.0–10.5)
nRBC: 0 % (ref 0.0–0.2)

## 2023-04-10 MED ORDER — VANCOMYCIN HCL IN DEXTROSE 1-5 GM/200ML-% IV SOLN
1000.0000 mg | INTRAVENOUS | Status: AC
  Administered 2023-04-11: 1000 mg via INTRAVENOUS
  Filled 2023-04-10: qty 200

## 2023-04-10 MED ORDER — POTASSIUM CHLORIDE 10 MEQ/100ML IV SOLN
10.0000 meq | INTRAVENOUS | Status: AC
  Administered 2023-04-10 (×4): 10 meq via INTRAVENOUS
  Filled 2023-04-10: qty 100

## 2023-04-10 NOTE — TOC Initial Note (Signed)
Transition of Care [facility]) - Initial/Assessment Note    Patient Details  Name: Paige Roberts MRN: [id number] Date of Birth: 1976/03/30  Transition of Care [facility]) CM/SW Contact:    Durenda Guthrie, RN Phone Number: 04/10/2023, 11:47 AM  Clinical Narrative:                  Transition of Care [facility]) Department has reviewed patient and no TOC needs have been identified at this time. We will continue to monitor patient advancement through Interdisciplinary progressions and if new patient needs arise, please place a consult.        Patient Goals and CMS Choice            Expected Discharge Plan and Services                                              Prior Living Arrangements/Services                       Activities of Daily Living Home Assistive Devices/Equipment: None ADL Screening (condition at time of admission) Patient's cognitive ability adequate to safely complete daily activities?: Yes Is the patient deaf or have difficulty hearing?: No Does the patient have difficulty seeing, even when wearing glasses/contacts?: No Does the patient have difficulty concentrating, remembering, or making decisions?: No Patient able to express need for assistance with ADLs?: No Does the patient have difficulty dressing or bathing?: No Independently performs ADLs?: Yes (appropriate for developmental age) Does the patient have difficulty walking or climbing stairs?: No Weakness of Legs: None Weakness of Arms/Hands: None  Permission Sought/Granted                  Emotional Assessment              Admission diagnosis:  Hiatal hernia [K44.9] Incarcerated hiatal hernia [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] Gastric outflow obstruction [K31.1] Patient Active Problem List   Diagnosis Date Noted   Incarcerated hiatal hernia 04/09/2023   PCP:  Pcp, No Pharmacy:   [id number] - [place] - [address] AT [facility] [address] [place] [address] Phone: [phone] Fax: [phone]     Social Determinants of Health (SDOH) Social History: SDOH Screenings   Food Insecurity: No Food Insecurity (04/09/2023)  Housing: Low Risk  (04/09/2023)  Transportation Needs: No Transportation Needs (04/09/2023)  Utilities: Not At Risk (04/09/2023)  Tobacco Use: Low Risk  (04/08/2023)   SDOH Interventions:     Readmission Risk Interventions     No data to display

## 2023-04-10 NOTE — Progress Notes (Signed)
[facility] Progress Note     Subjective: CC:   Denies abdominal pain. Reports NGT discomfort. Has a history of GERD, inability to eat large meals. Reports acute onset nausea/vomiting Friday night and into Saturday, all day. Not tolerating ice/sips of water at home.   Denies abdominal surgery, denies daily meds. Denies tobacco use. Denies significant EtOH use. Works in Nurse, children's.   Objective: Vital signs in last 24 hours: Temp:  [98 F (36.7 C)-98.6 F (37 C)] 98.2 F (36.8 C) (04/29 0758) Pulse Rate:  [69-90] 75 (04/29 0758) Resp:  [17-18] 18 (04/29 0758) BP: (109-131)/(67-82) 131/82 (04/29 0758) SpO2:  [96 %-99 %] 96 % (04/29 0758)    Intake/Output from previous day: 04/28 0701 - 04/29 0700 In: 180 [NG/GT:180] Out: 410 [Emesis/NG output:410] Intake/Output this shift: No intake/output data recorded.  PE: Gen:  Alert, NAD, pleasant Card:  Regular rate and rhythm Pulm:  Normal effort ORA Abd: Soft, non-tender, non-distended  NG - 410 mL, thick tan effluent Skin: warm and dry, no rashes  Psych: A&Ox3   Lab Results:  Recent Labs    04/08/23 2300 04/10/23 0155  WBC 13.9* 6.9  HGB 12.6 10.1*  HCT 40.6 33.9*  PLT 447* 259   BMET Recent Labs    04/08/23 2300 04/09/23 1202 04/10/23 0155  NA 139  --  137  K 3.3*  --  3.2*  CL 92*  --  102  CO2 27  --  28  GLUCOSE 148*  --  109*  BUN 15  --  9  CREATININE 0.99 0.85 0.87  CALCIUM 10.4*  --  8.6*   PT/INR No results for input(s): "LABPROT", "INR" in the last 72 hours. CMP     Component Value Date/Time   NA 137 04/10/2023 0155   K 3.2 (L) 04/10/2023 0155   CL 102 04/10/2023 0155   CO2 28 04/10/2023 0155   GLUCOSE 109 (H) 04/10/2023 0155   BUN 9 04/10/2023 0155   CREATININE 0.87 04/10/2023 0155   CALCIUM 8.6 (L) 04/10/2023 0155   PROT 8.5 (H) 04/08/2023 2300   ALBUMIN 5.1 (H) 04/08/2023 2300   AST 12 (L) 04/08/2023 2300   ALT 11 04/08/2023 2300   ALKPHOS 48 04/08/2023 2300    BILITOT 0.9 04/08/2023 2300   GFRNONAA >60 04/10/2023 0155   Lipase     Component Value Date/Time   LIPASE 14 04/08/2023 2300       Studies/Results: DG Abd Portable 1V-Small Bowel Obstruction Protocol-initial, 8 hr delay  Result Date: 04/09/2023 CLINICAL DATA:  Evaluate 2 hours after contrast injection EXAM: PORTABLE ABDOMEN - 1 VIEW COMPARISON:  CT of the abdomen and pelvis April 08, 2023. KUB April 09, 2023. FINDINGS: The patient has a large paraesophageal gastric hernia better appreciated on previous CT imaging. An NG tube terminates into the portion of stomach below the diaphragm. The contrast injected through the tube is confined to the portion of stomach inferior to the diaphragm. IMPRESSION: The NG tube terminates in the portion of stomach below the diaphragm. The contrast injected through the tube is confined to the portion of stomach inferior to the diaphragm. The patient's known large paraesophageal gastric hernia was better assessed on the recent CT scan. Para esophageal gastric hernias can be prone to volvulus. Electronically Signed   By: Gerome Sam III M.D.   On: 04/09/2023 14:42   DG Abd Portable 1 View  Result Date: 04/09/2023 CLINICAL DATA:  Check gastric catheter placement EXAM: PORTABLE ABDOMEN -  1 VIEW COMPARISON:  CT from the previous day. FINDINGS: Gastric catheter is noted coiled within the stomach. Persistent fluid-filled distal gastric hernia is noted. No free air is noted. IMPRESSION: Gastric catheter is coiled within the proximal stomach. Persistent herniation of the distal aspect of the stomach is noted. Electronically Signed   By: Alcide Clever M.D.   On: 04/09/2023 02:43   CT Renal Stone Study  Result Date: 04/09/2023 CLINICAL DATA:  Vomiting. EXAM: CT ABDOMEN AND PELVIS WITHOUT CONTRAST TECHNIQUE: Multidetector CT imaging of the abdomen and pelvis was performed following the standard protocol without IV contrast. RADIATION DOSE REDUCTION: This exam was  performed according to the departmental dose-optimization program which includes automated exposure control, adjustment of the mA and/or kV according to patient size and/or use of iterative reconstruction technique. COMPARISON:  None Available. FINDINGS: Lower chest: There is elevation of the left hemidiaphragm. No acute abnormalities are identified. Hepatobiliary: No focal liver abnormality is seen. No gallstones, gallbladder wall thickening, or biliary dilatation. Pancreas: Unremarkable. No pancreatic ductal dilatation or surrounding inflammatory changes. Spleen: Normal in size without focal abnormality. Adrenals/Urinary Tract: Adrenal glands are unremarkable. Kidneys are normal, without renal calculi, focal lesion, or hydronephrosis. The urinary bladder is empty and subsequently limited in evaluation. Stomach/Bowel: There is a large gastric hernia which contains a large portion of the body of the stomach. The stomach is markedly distended, with the remainder of the gastric body and gastric fundus seen within the left upper quadrant. An abrupt transition zone is seen near the level of the gastric antrum which is positioned within the inferior medial aspect of the previously noted gastric hernia (coronal reformatted images 46 through 56, CT series 5). The small bowel loops distal to this level are completely decompressed. A mild amount of air and stool are seen throughout the large bowel. The appendix is normal in appearance. Vascular/Lymphatic: No significant vascular findings are present. No enlarged abdominal or pelvic lymph nodes. Reproductive: The uterus and right adnexa are unremarkable. A 1.8 cm diameter simple cyst is seen within the left adnexa. Other: No abdominal wall hernia or abnormality. No abdominopelvic ascites. Musculoskeletal: No acute or significant osseous findings. IMPRESSION: 1. Very large gastric hernia, as described above, with subsequent high-grade gastric outlet obstruction. 2. Simple left  adnexal cyst, likely ovarian in origin. No follow-up imaging is recommended. This recommendation follows ACR consensus guidelines: White Paper of the ACR Incidental Findings Committee II on Adnexal Findings. J Am Coll Radiol [phone]. Electronically Signed   By: Aram Candela M.D.   On: 04/09/2023 00:11    Anti-infectives: Anti-infectives (From admission, onward)    None       Assessment/Plan  Hiatal hernia with gastric volvulus   - afebrile. WBC 6.9 from 13.9 - NG to LIWS, contrast given yesterday remained in stomach, KUB this AM pending - persistent gastric outlet obstruction, would benefit from surgical repair this admission. Will discuss final plan of care with my attending, Dr. Derrell Lolling    FEN: NG to LIWS, NPO, K 3.2 - give 4 runs IV KCl , check Mg level ID: none VTE: SCD's, lovenox Dispo: med-surg, CCS service    LOS: 1 day   I reviewed nursing notes, ED provider notes, last 24 h vitals and pain scores, last 48 h intake and output, last 24 h labs and trends, and last 24 h imaging results.  This care required moderate level of medical decision making.   Hosie Spangle, PA-C [facility] Please see Amion for pager  number during day hours 7:00am-4:30pm

## 2023-04-10 NOTE — Anesthesia Preprocedure Evaluation (Signed)
Anesthesia Evaluation  Patient identified by MRN, date of birth, ID band Patient awake    Reviewed: Allergy & Precautions, NPO status , Patient's Chart, lab work & pertinent test results  Airway Mallampati: II  TM Distance: >3 FB Neck ROM: Full    Dental  (+) Dental Advisory Given, Chipped   Pulmonary neg pulmonary ROS   Pulmonary exam normal breath sounds clear to auscultation       Cardiovascular negative cardio ROS Normal cardiovascular exam Rhythm:Regular Rate:Normal     Neuro/Psych negative neurological ROS  negative psych ROS   GI/Hepatic Neg liver ROS, hiatal hernia,,,Incarcerated hiatal hernia   Endo/Other  negative endocrine ROS    Renal/GU negative Renal ROS  negative genitourinary   Musculoskeletal negative musculoskeletal ROS (+)    Abdominal   Peds  Hematology  (+) Blood dyscrasia, anemia   Anesthesia Other Findings   Reproductive/Obstetrics negative OB ROS                             Anesthesia Physical Anesthesia Plan  ASA: 2  Anesthesia Plan: General   Post-op Pain Management: Dilaudid IV, Precedex and Ofirmev IV (intra-op)*   Induction: Intravenous, Rapid sequence and Cricoid pressure planned  PONV Risk Score and Plan: 4 or greater and Treatment may vary due to age or medical condition, Midazolam, Scopolamine patch - Pre-op, Ondansetron and Dexamethasone  Airway Management Planned: Oral ETT  Additional Equipment: None  Intra-op Plan:   Post-operative Plan: Extubation in OR  Informed Consent: I have reviewed the patients History and Physical, chart, labs and discussed the procedure including the risks, benefits and alternatives for the proposed anesthesia with the patient or authorized representative who has indicated his/her understanding and acceptance.     Dental advisory given  Plan Discussed with: Anesthesiologist and CRNA  Anesthesia Plan  Comments:         Anesthesia Quick Evaluation

## 2023-04-11 ENCOUNTER — Inpatient Hospital Stay (HOSPITAL_COMMUNITY): Payer: BC Managed Care – PPO | Admitting: Anesthesiology

## 2023-04-11 ENCOUNTER — Other Ambulatory Visit: Payer: Self-pay

## 2023-04-11 ENCOUNTER — Encounter (HOSPITAL_COMMUNITY): Admission: EM | Disposition: A | Discharge status: Home / Self Care | Payer: Self-pay | Source: Home / Self Care

## 2023-04-11 ENCOUNTER — Encounter (HOSPITAL_COMMUNITY): Payer: Self-pay

## 2023-04-11 HISTORY — PX: XI ROBOTIC ASSISTED HIATAL HERNIA REPAIR: SHX6889

## 2023-04-11 HISTORY — PX: INSERTION OF MESH: SHX5868

## 2023-04-11 SURGERY — REPAIR, HERNIA, HIATAL, ROBOT-ASSISTED
Anesthesia: General | Site: Abdomen

## 2023-04-11 MED ORDER — HYDROMORPHONE HCL 1 MG/ML IJ SOLN
0.2500 mg | INTRAMUSCULAR | Status: DC | PRN
  Administered 2023-04-11 (×2): 0.5 mg via INTRAVENOUS

## 2023-04-11 MED ORDER — SODIUM CHLORIDE 0.9 % IR SOLN
Status: DC | PRN
  Administered 2023-04-11: 500 mL

## 2023-04-11 MED ORDER — ONDANSETRON HCL 4 MG/2ML IJ SOLN: 4.0000 mg | Freq: Once | INTRAMUSCULAR | Status: DC | PRN

## 2023-04-11 MED ORDER — 0.9 % SODIUM CHLORIDE (POUR BTL) OPTIME
TOPICAL | Status: DC | PRN
  Administered 2023-04-11: 1000 mL

## 2023-04-11 MED ORDER — PHENYLEPHRINE HCL (PRESSORS) 10 MG/ML IV SOLN
INTRAVENOUS | Status: DC | PRN
  Administered 2023-04-11 (×2): 160 ug via INTRAVENOUS

## 2023-04-11 MED ORDER — CHLORHEXIDINE GLUCONATE 0.12 % MT SOLN
OROMUCOSAL | Status: AC
  Administered 2023-04-11: 15 mL via OROMUCOSAL
  Filled 2023-04-11: qty 15

## 2023-04-11 MED ORDER — BUPIVACAINE LIPOSOME 1.3 % IJ SUSP
INTRAMUSCULAR | Status: AC
  Filled 2023-04-11: qty 20

## 2023-04-11 MED ORDER — BUPIVACAINE HCL (PF) 0.25 % IJ SOLN
INTRAMUSCULAR | Status: DC | PRN
  Administered 2023-04-11: 16 mL

## 2023-04-11 MED ORDER — PROPOFOL 10 MG/ML IV BOLUS
INTRAVENOUS | Status: DC | PRN
  Administered 2023-04-11: 170 mg via INTRAVENOUS

## 2023-04-11 MED ORDER — MIDAZOLAM HCL 2 MG/2ML IJ SOLN
INTRAMUSCULAR | Status: AC
  Filled 2023-04-11: qty 2

## 2023-04-11 MED ORDER — ROCURONIUM BROMIDE 10 MG/ML (PF) SYRINGE
PREFILLED_SYRINGE | INTRAVENOUS | Status: DC | PRN
  Administered 2023-04-11: 40 mg via INTRAVENOUS
  Administered 2023-04-11: 20 mg via INTRAVENOUS
  Administered 2023-04-11: 10 mg via INTRAVENOUS
  Administered 2023-04-11: 20 mg via INTRAVENOUS

## 2023-04-11 MED ORDER — SUGAMMADEX SODIUM 200 MG/2ML IV SOLN
INTRAVENOUS | Status: DC | PRN
  Administered 2023-04-11: 200 mg via INTRAVENOUS
  Administered 2023-04-11: 50 mg via INTRAVENOUS

## 2023-04-11 MED ORDER — DEXAMETHASONE SODIUM PHOSPHATE 10 MG/ML IJ SOLN
INTRAMUSCULAR | Status: DC | PRN
  Administered 2023-04-11: 5 mg via INTRAVENOUS

## 2023-04-11 MED ORDER — HYDROCODONE-ACETAMINOPHEN 7.5-325 MG PO TABS
1.0000 | ORAL_TABLET | ORAL | Status: DC | PRN
  Administered 2023-04-11 – 2023-04-12 (×2): 1 via ORAL
  Filled 2023-04-11 (×2): qty 1

## 2023-04-11 MED ORDER — LIDOCAINE 2% (20 MG/ML) 5 ML SYRINGE
INTRAMUSCULAR | Status: DC | PRN
  Administered 2023-04-11: 60 mg via INTRAVENOUS

## 2023-04-11 MED ORDER — KCL IN DEXTROSE-NACL 20-5-0.45 MEQ/L-%-% IV SOLN
INTRAVENOUS | Status: DC
  Filled 2023-04-11 (×2): qty 1000

## 2023-04-11 MED ORDER — ENOXAPARIN SODIUM 40 MG/0.4ML IJ SOSY: 40.0000 mg | PREFILLED_SYRINGE | INTRAMUSCULAR | Status: DC

## 2023-04-11 MED ORDER — SODIUM CHLORIDE (PF) 0.9 % IJ SOLN
INTRAMUSCULAR | Status: DC | PRN
  Administered 2023-04-11: 40 mL

## 2023-04-11 MED ORDER — FENTANYL CITRATE (PF) 250 MCG/5ML IJ SOLN
INTRAMUSCULAR | Status: DC | PRN
  Administered 2023-04-11: 150 ug via INTRAVENOUS
  Administered 2023-04-11: 50 ug via INTRAVENOUS

## 2023-04-11 MED ORDER — AMISULPRIDE (ANTIEMETIC) 5 MG/2ML IV SOLN
INTRAVENOUS | Status: AC
  Filled 2023-04-11: qty 4

## 2023-04-11 MED ORDER — AMISULPRIDE (ANTIEMETIC) 5 MG/2ML IV SOLN
10.0000 mg | Freq: Once | INTRAVENOUS | Status: AC | PRN
  Administered 2023-04-11: 10 mg via INTRAVENOUS

## 2023-04-11 MED ORDER — PHENYLEPHRINE HCL-NACL 20-0.9 MG/250ML-% IV SOLN
INTRAVENOUS | Status: DC | PRN
  Administered 2023-04-11: 15 ug/min via INTRAVENOUS

## 2023-04-11 MED ORDER — HYDROMORPHONE HCL 1 MG/ML IJ SOLN
INTRAMUSCULAR | Status: AC
  Filled 2023-04-11: qty 1

## 2023-04-11 MED ORDER — MIDAZOLAM HCL 5 MG/5ML IJ SOLN
INTRAMUSCULAR | Status: DC | PRN
  Administered 2023-04-11: 2 mg via INTRAVENOUS

## 2023-04-11 MED ORDER — SUCCINYLCHOLINE CHLORIDE 200 MG/10ML IV SOSY
PREFILLED_SYRINGE | INTRAVENOUS | Status: DC | PRN
  Administered 2023-04-11: 100 mg via INTRAVENOUS

## 2023-04-11 MED ORDER — BUPIVACAINE HCL (PF) 0.25 % IJ SOLN
INTRAMUSCULAR | Status: AC
  Filled 2023-04-11: qty 30

## 2023-04-11 MED ORDER — PROPOFOL 10 MG/ML IV BOLUS
INTRAVENOUS | Status: AC
  Filled 2023-04-11: qty 20

## 2023-04-11 MED ORDER — CHLORHEXIDINE GLUCONATE 0.12 % MT SOLN: 15.0000 mL | Freq: Once | OROMUCOSAL | Status: AC

## 2023-04-11 MED ORDER — LACTATED RINGERS IV SOLN: INTRAVENOUS | Status: DC

## 2023-04-11 MED ORDER — ONDANSETRON HCL 4 MG/2ML IJ SOLN
INTRAMUSCULAR | Status: DC | PRN
  Administered 2023-04-11: 4 mg via INTRAVENOUS

## 2023-04-11 MED ORDER — ORAL CARE MOUTH RINSE: 15.0000 mL | Freq: Once | OROMUCOSAL | Status: AC

## 2023-04-11 MED ORDER — FENTANYL CITRATE (PF) 250 MCG/5ML IJ SOLN
INTRAMUSCULAR | Status: AC
  Filled 2023-04-11: qty 5

## 2023-04-11 SURGICAL SUPPLY — 60 items
APPLIER CLIP 5 13 M/L LIGAMAX5 (MISCELLANEOUS)
CANNULA REDUCER 12-8 DVNC XI (CANNULA) ×1 IMPLANT
CHLORAPREP W/TINT 26 (MISCELLANEOUS) ×1 IMPLANT
CLIP APPLIE 5 13 M/L LIGAMAX5 (MISCELLANEOUS) IMPLANT
COVER MAYO STAND STRL (DRAPES) ×1 IMPLANT
COVER SURGICAL LIGHT HANDLE (MISCELLANEOUS) ×1 IMPLANT
COVER TIP SHEARS 8 DVNC (MISCELLANEOUS) IMPLANT
DEFOGGER SCOPE WARMER CLEARIFY (MISCELLANEOUS) ×1 IMPLANT
DERMABOND ADVANCED .7 DNX12 (GAUZE/BANDAGES/DRESSINGS) ×1 IMPLANT
DEVICE TROCAR PUNCTURE CLOSURE (ENDOMECHANICALS) ×1 IMPLANT
DRAIN PENROSE 0.5X18 (DRAIN) IMPLANT
DRAPE ARM DVNC X/XI (DISPOSABLE) ×4 IMPLANT
DRAPE CARDIOVASC SPLIT 88X140 (DRAPES) ×1 IMPLANT
DRAPE COLUMN DVNC XI (DISPOSABLE) ×1 IMPLANT
DRAPE ORTHO SPLIT 77X108 STRL (DRAPES) ×1
DRAPE SURG ORHT 6 SPLT 77X108 (DRAPES) ×1 IMPLANT
DRIVER NDL MEGA SUTCUT DVNCXI (INSTRUMENTS) ×1 IMPLANT
DRIVER NDLE MEGA SUTCUT DVNCXI (INSTRUMENTS) ×1 IMPLANT
ELECT REM PT RETURN 9FT ADLT (ELECTROSURGICAL) ×1
ELECTRODE REM PT RTRN 9FT ADLT (ELECTROSURGICAL) ×1 IMPLANT
FORCEPS BPLR FENES DVNC XI (FORCEP) ×1 IMPLANT
GLOVE BIO SURGEON STRL SZ7.5 (GLOVE) ×5 IMPLANT
GOWN STRL REUS W/ TWL LRG LVL3 (GOWN DISPOSABLE) ×1 IMPLANT
GOWN STRL REUS W/ TWL XL LVL3 (GOWN DISPOSABLE) ×2 IMPLANT
GOWN STRL REUS W/TWL 2XL LVL3 (GOWN DISPOSABLE) ×1 IMPLANT
GOWN STRL REUS W/TWL LRG LVL3 (GOWN DISPOSABLE) ×1
GOWN STRL REUS W/TWL XL LVL3 (GOWN DISPOSABLE) ×2
IRRIG SUCT STRYKERFLOW 2 WTIP (MISCELLANEOUS) ×1
IRRIGATION SUCT STRKRFLW 2 WTP (MISCELLANEOUS) ×1 IMPLANT
KIT BASIN OR (CUSTOM PROCEDURE TRAY) ×1 IMPLANT
KIT TURNOVER KIT B (KITS) IMPLANT
MARKER SKIN DUAL TIP RULER LAB (MISCELLANEOUS) ×1 IMPLANT
MESH BIO-A 7X10 SYN MAT (Mesh General) ×1 IMPLANT
NDL 22X1.5 STRL (OR ONLY) (MISCELLANEOUS) ×1 IMPLANT
NDL INSUFFLATION 14GA 120MM (NEEDLE) ×1 IMPLANT
NEEDLE 22X1.5 STRL (OR ONLY) (MISCELLANEOUS) ×1 IMPLANT
NEEDLE INSUFFLATION 14GA 120MM (NEEDLE) ×1 IMPLANT
OBTURATOR OPTICAL STND 8 DVNC (TROCAR)
OBTURATOR OPTICALSTD 8 DVNC (TROCAR) IMPLANT
PENCIL SMOKE EVACUATOR (MISCELLANEOUS) IMPLANT
RETRACTOR GRSP SML 8 DVNC XI (INSTRUMENTS) ×1 IMPLANT
SCISSORS LAP 5X35 DISP (ENDOMECHANICALS) IMPLANT
SCISSORS MNPLR CVD DVNC XI (INSTRUMENTS) ×1 IMPLANT
SEAL CANN UNIV 5-8 DVNC XI (MISCELLANEOUS) ×3 IMPLANT
SEALER VESSEL EXT DVNC XI (MISCELLANEOUS) ×1 IMPLANT
SET TUBE SMOKE EVAC HIGH FLOW (TUBING) ×1 IMPLANT
SPIKE FLUID TRANSFER (MISCELLANEOUS) ×1 IMPLANT
STAPLER CANNULA SEAL DVNC XI (STAPLE) ×1 IMPLANT
STAPLER VISISTAT 35W (STAPLE) IMPLANT
STOPCOCK 4 WAY LG BORE MALE ST (IV SETS) ×1 IMPLANT
SUT ETHIBOND 0 36 GRN (SUTURE) ×2 IMPLANT
SUT ETHIBOND 2 0 SH (SUTURE)
SUT ETHIBOND 2 0 SH 36X2 (SUTURE) IMPLANT
SUT MNCRL AB 4-0 PS2 18 (SUTURE) ×1 IMPLANT
SUT SILK 0 SH 30 (SUTURE) ×1 IMPLANT
SUT VICRYL 0 UR6 27IN ABS (SUTURE) ×1 IMPLANT
SYR 30ML SLIP (SYRINGE) ×1 IMPLANT
TRAY FOLEY MTR SLVR 16FR STAT (SET/KITS/TRAYS/PACK) ×1 IMPLANT
TRAY LAPAROSCOPIC MC (CUSTOM PROCEDURE TRAY) ×1 IMPLANT
TROCAR ADV FIXATION 5X100MM (TROCAR) ×1 IMPLANT

## 2023-04-11 NOTE — Op Note (Signed)
04/11/2023  12:02 PM  PATIENT:  Paige Roberts  47 y.o. female  PRE-OPERATIVE DIAGNOSIS:  Hiatal Hernia  POST-OPERATIVE DIAGNOSIS:  Hiatal Hernia  PROCEDURE:  Procedure(s): XI ROBOTIC ASSISTED HIATAL HERNIA REPAIR WITH MESH AND TOUPET FUNDOPLICATION (N/A) INSERTION OF MESH (N/A)  SURGEON:  Surgeon(s) and Role:    * Axel Filler, MD - Primary  ASSISTANTS: Berenda Morale, RNFA   ANESTHESIA:   local and general  EBL:  none   BLOOD ADMINISTERED:none  DRAINS: none   LOCAL MEDICATIONS USED:  BUPIVICAINE and exparel  SPECIMEN:  No Specimen  DISPOSITION OF SPECIMEN:  N/A  COUNTS:  YES  TOURNIQUET:  * No tourniquets in log *  DICTATION:  .Dragon Dictation The patient was taken back to the operating room and placed in the lithotomy position with bilateral SCDs in place. She was prepped and draped in the usual sterile fashion.  A timeout was called and off all facts and antibiotics were confirmed.  A Veress needle technique was used to insufflate the abdomen to 14 mm of mercury. This was done the midclavicular line just lateral to the umbilicus. A 5 mm trocar and camera were then placed intra-abdominally. Injury to any intra-abdominal organs. A 12 mm trocar was then placed in the epigastrium and the midline under direct visualization.  A 5 mm trocar was then placed in the left subcostal margin, left lower quadrant, and umbilicus under direct visualization. At this time a Nathanson liver retractor was then placed to retract the liver. At this time it could be seen that there was a large amount of omentumand stomach within the left chest. This was grasped and brought down to the abdomen.   At this time we began to dissect the hernia sac anteriorly. We dissected this circumferentially around the esophagus. And up into the anterior mediastinum. We proceeded to dissect the left portion of the esophagus and hiatal hernia sac. This brought [place] to the left crus. At this point we  retracted the esophagus to the left and dissected the right crus, until the left crus could be seen in the most posterior portion.    At this time we continued to dissect the surrounding adventitial tissue around the esophagus cephalad in the mediastinum. The stomach was chronically adherhent to the chest cavity.  This allowed the stomach to lay within the abdomen with undue tension.  We then began to dissect away the short gastrics on the greater curvature of the stomach. This allowed [place] to release some tension of the stomach to allow [place] to dissect the left crus more appropriately.  At this time we were able to visualize the crus had a large defect. 2-0 Ethibond stitches were then used in a figure-of-eight fashion 2. This allowed the crus to be reapproximated  without strangulation or narrowing of the esophagus. At this time a piece of Gore Bio-A mesh was cut to shape and placed into the abdomen. This was in placed over the suture repair posterior to the esophagus over the hiatal repair and stapled using the [employer]ien Universal hernia stapler, with a 4.8 mm load. This allowed the mesh to lay flat against the hiatal repair.  At this time the esophagus was retracted inferiorly. We proceeded to pass the greater curvature of the stomach posterior to the esophagus. A shoeshine technique was performed. At this time the Nissen fundoplication was sutured using 2-0 silk's and interrupted standard fashion approximately 1 cm apart approximately 2-3 cm in length. The middle stitch incorporated a thin  layer of the esophagus into the wrap. The wrap laid approximately the 10:00 position.2-0 silk's were used as collar stitches to secure the wrap to the hiatus. These were done in interrupted fashion.     At this time the area checked for hemostasis which was excellent.  the 12 mm trocar site was reapproximated using an Endo Close and 0 Vicryl interrupted standard fashion.The pneumoperitoneum was evacuated all trochars were  removed. All trocar sites were then reapproximated using a 4-0 Monocryl in a subcuticular fashion. the skin was dressed with a LiquiBand.   PLAN OF CARE: Admit to inpatient   PATIENT DISPOSITION:  PACU - hemodynamically stable.   Delay start of Pharmacological VTE agent (>24hrs) due to surgical blood loss or risk of bleeding: not applicable

## 2023-04-11 NOTE — Progress Notes (Signed)
Day of Surgery   Subjective/Chief Complaint: Pt doing well this AM NGT in place   Objective: Vital signs in last 24 hours: Temp:  [97.8 F (36.6 C)-98.7 F (37.1 C)] 98.4 F (36.9 C) (04/30 0831) Pulse Rate:  [63-82] 82 (04/30 0831) Resp:  [16-18] 16 (04/30 0831) BP: (114-148)/(71-78) 148/78 (04/30 0831) SpO2:  [99 %-100 %] 100 % (04/30 0831) Weight:  [74.8 kg] 74.8 kg (04/30 0831)    Intake/Output from previous day: 04/29 0701 - 04/30 0700 In: 90 [NG/GT:90] Out: 400 [Emesis/NG output:400] Intake/Output this shift: No intake/output data recorded.  PE:  Constitutional: No acute distress, conversant, appears states age. Eyes: Anicteric sclerae, moist conjunctiva, no lid lag Lungs: Clear to auscultation bilaterally, normal respiratory effort CV: regular rate and rhythm, no murmurs, no peripheral edema, pedal pulses 2+ GI: Soft, no masses or hepatosplenomegaly, non-tender to palpation Skin: No rashes, palpation reveals normal turgor Psychiatric: appropriate judgment and insight, oriented to person, place, and time   Lab Results:  Recent Labs    04/08/23 2300 04/10/23 0155  WBC 13.9* 6.9  HGB 12.6 10.1*  HCT 40.6 33.9*  PLT 447* 259   BMET Recent Labs    04/08/23 2300 04/09/23 1202 04/10/23 0155  NA 139  --  137  K 3.3*  --  3.2*  CL 92*  --  102  CO2 27  --  28  GLUCOSE 148*  --  109*  BUN 15  --  9  CREATININE 0.99 0.85 0.87  CALCIUM 10.4*  --  8.6*   PT/INR No results for input(s): "LABPROT", "INR" in the last 72 hours. ABG No results for input(s): "PHART", "HCO3" in the last 72 hours.  Invalid input(s): "PCO2", "PO2"  Studies/Results: DG Abd Portable 1V  Result Date: 04/10/2023 CLINICAL DATA:  [id number] Gastric outlet obstruction [id number] EXAM: PORTABLE ABDOMEN - 1 VIEW COMPARISON:  CT 04/08/2023 FINDINGS: Nasogastric tube and side port overlies the intra-abdominal portion of the stomach, persistent distension of a large paraesophageal gastric hernia  is again noted. There is residual contrast material within stomach. There is some contrast material noted within the right colon and hepatic flexure. IMPRESSION: Persistent distention of a large paraesophageal hernia. Passage of some contrast to the right colon and hepatic flexure since the prior exam, though with a significant residual amount of contrast within the intra-abdominal portion of the stomach. Findings suggest high-grade partial obstruction. Electronically Signed   By: Caprice Renshaw M.D.   On: 04/10/2023 11:59   DG Abd Portable 1V-Small Bowel Obstruction Protocol-initial, 8 hr delay  Result Date: 04/09/2023 CLINICAL DATA:  Evaluate 2 hours after contrast injection EXAM: PORTABLE ABDOMEN - 1 VIEW COMPARISON:  CT of the abdomen and pelvis April 08, 2023. KUB April 09, 2023. FINDINGS: The patient has a large paraesophageal gastric hernia better appreciated on previous CT imaging. An NG tube terminates into the portion of stomach below the diaphragm. The contrast injected through the tube is confined to the portion of stomach inferior to the diaphragm. IMPRESSION: The NG tube terminates in the portion of stomach below the diaphragm. The contrast injected through the tube is confined to the portion of stomach inferior to the diaphragm. The patient's known large paraesophageal gastric hernia was better assessed on the recent CT scan. Para esophageal gastric hernias can be prone to volvulus. Electronically Signed   By: Gerome Sam III M.D.   On: 04/09/2023 14:42    Anti-infectives: Anti-infectives (From admission, onward)    Start  Dose/Rate Route Frequency Ordered Stop   04/11/23 0900  [MAR Hold]  vancomycin (VANCOCIN) IVPB 1000 mg/200 mL premix        (MAR Hold since Tue 04/11/2023 at 0824.Hold Reason: Transfer to a Procedural area)   1,000 mg 200 mL/hr over 60 Minutes Intravenous On call to O.R. 04/10/23 1515 04/12/23 0559       Assessment/Plan: 29F with gastric volvulus and hiatal  hernia To OR today for robotic HHR w/ mesh and fundoplication    LOS: 2 days    Axel Filler 04/11/2023

## 2023-04-11 NOTE — Anesthesia Postprocedure Evaluation (Signed)
Anesthesia Post Note  Patient: Brinly Maietta Babson  Procedure(s) Performed: XI ROBOTIC ASSISTED HIATAL HERNIA REPAIR WITH MESH AND FUNDOPLICATION (Abdomen) INSERTION OF MESH (Abdomen)     Patient location during evaluation: PACU Anesthesia Type: General Level of consciousness: awake and alert and oriented Pain management: pain level controlled Vital Signs Assessment: post-procedure vital signs reviewed and stable Respiratory status: spontaneous breathing, nonlabored ventilation and respiratory function stable Cardiovascular status: stable and blood pressure returned to baseline Postop Assessment: no apparent nausea or vomiting Anesthetic complications: no   No notable events documented.  Last Vitals:  Vitals:   04/11/23 1245 04/11/23 1300  BP: 118/77 121/79  Pulse: 98 99  Resp: 16 14  Temp:  36.7 C  SpO2: 96% 96%    Last Pain:  Vitals:   04/11/23 1300  TempSrc:   PainSc: 2                  Sophira Rumler A.

## 2023-04-11 NOTE — Anesthesia Procedure Notes (Signed)
Procedure Name: Intubation Date/Time: 04/11/2023 9:44 AM  Performed by: Margarita Rana, CRNAPre-anesthesia Checklist: Patient identified, Patient being monitored, Timeout performed, Emergency Drugs available and Suction available Patient Re-evaluated:Patient Re-evaluated prior to induction Oxygen Delivery Method: Circle System Utilized Preoxygenation: Pre-oxygenation with 100% oxygen Induction Type: IV induction Ventilation: Mask ventilation without difficulty Laryngoscope Size: Mac and 4 Grade View: Grade II Tube type: Oral Tube size: 7.5 mm Number of attempts: 1 Airway Equipment and Method: Stylet Placement Confirmation: ETT inserted through vocal cords under direct vision, positive ETCO2 and breath sounds checked- equal and bilateral Secured at: 21 cm Tube secured with: Tape Dental Injury: Teeth and Oropharynx as per pre-operative assessment

## 2023-04-11 NOTE — Transfer of Care (Signed)
Immediate Anesthesia Transfer of Care Note  Patient: Paige Roberts  Procedure(s) Performed: XI ROBOTIC ASSISTED HIATAL HERNIA REPAIR WITH MESH AND FUNDOPLICATION (Abdomen) INSERTION OF MESH (Abdomen)  Patient Location: PACU  Anesthesia Type:General  Level of Consciousness: awake, alert , oriented, patient cooperative, and responds to stimulation  Airway & Oxygen Therapy: Patient Spontanous Breathing  Post-op Assessment: Report given to RN and Post -op Vital signs reviewed and stable  Post vital signs: Reviewed and stable  Last Vitals:  Vitals Value Taken Time  BP 123/80 04/11/23 1228  Temp    Pulse 114 04/11/23 1229  Resp 14 04/11/23 1229  SpO2 91 % 04/11/23 1229  Vitals shown include unvalidated device data.  Last Pain:  Vitals:   04/11/23 0831  TempSrc: Oral  PainSc: 0-No pain      Patients Stated Pain Goal: 6 (04/08/23 2253)  Complications: No notable events documented.

## 2023-04-12 ENCOUNTER — Encounter (HOSPITAL_COMMUNITY): Payer: Self-pay | Admitting: General Surgery

## 2023-04-12 ENCOUNTER — Inpatient Hospital Stay (HOSPITAL_COMMUNITY): Payer: BC Managed Care – PPO

## 2023-04-12 LAB — BASIC METABOLIC PANEL
Anion gap: 10 (ref 5–15)
BUN: <5 mg/dL — ABNORMAL LOW (ref 6–20)
CO2: 23 mmol/L (ref 22–32)
Calcium: 8.4 mg/dL — ABNORMAL LOW (ref 8.9–10.3)
Chloride: 100 mmol/L (ref 98–111)
Creatinine, Ser: 0.67 mg/dL (ref 0.44–1.00)
GFR, Estimated: >60 mL/min (ref >60)
Glucose, Bld: 100 mg/dL — ABNORMAL HIGH (ref 70–99)
Potassium: 3.3 mmol/L — ABNORMAL LOW (ref 3.5–5.1)
Sodium: 133 mmol/L — ABNORMAL LOW (ref 135–145)

## 2023-04-12 LAB — CBC
HCT: 32.4 % — ABNORMAL LOW (ref 36.0–46.0)
Hemoglobin: 10 g/dL — ABNORMAL LOW (ref 12.0–15.0)
MCH: 22.9 pg — ABNORMAL LOW (ref 26.0–34.0)
MCHC: 30.9 g/dL (ref 30.0–36.0)
MCV: 74.1 fL — ABNORMAL LOW (ref 80.0–100.0)
Platelets: 242 10*3/uL (ref 150–400)
RBC: 4.37 MIL/uL (ref 3.87–5.11)
RDW: 15.7 % — ABNORMAL HIGH (ref 11.5–15.5)
WBC: 8.5 10*3/uL (ref 4.0–10.5)
nRBC: 0 % (ref 0.0–0.2)

## 2023-04-12 MED ORDER — PANTOPRAZOLE SODIUM 40 MG PO PACK: 40.0000 mg | PACK | Freq: Every day | ORAL | 0 refills | Status: AC

## 2023-04-12 MED ORDER — IOHEXOL 300 MG/ML  SOLN
75.0000 mL | Freq: Once | INTRAMUSCULAR | Status: AC | PRN
  Administered 2023-04-12: 75 mL via ORAL

## 2023-04-12 MED ORDER — HYDROCODONE-ACETAMINOPHEN 7.5-325 MG/15ML PO SOLN: 15.0000 mL | Freq: Four times a day (QID) | ORAL | 0 refills | Status: AC | PRN

## 2023-04-12 NOTE — Discharge Summary (Signed)
[facility] Discharge Summary   Patient ID: Paige Roberts MRN: [id number] DOB/AGE: 1976-02-26 47 y.o.  Admit date: 04/09/2023 Discharge date: 04/12/2023  Admitting Diagnosis: Incarceration of hiatal hernia with gastric outlet obstruction   Discharge Diagnosis S/p robotic hiatal hernia repair   Consultants None   Imaging: DG ESOPHAGUS W SINGLE CM (SOL OR THIN BA)  Result Date: 04/12/2023 CLINICAL DATA:  Status post hiatal hernia repair with Nissen fundoplication yesterday. Request water-soluble esophagram to evaluate for postoperative complication. EXAM: ESOPHOGRAM TECHNIQUE: Single contrast examination was performed using thin water-soluble contrast. Images taken in multiple obliquities. This exam was performed by Brayton El PA-C , and was supervised and interpreted by Dr. Gennette Pac. FLUOROSCOPY: Radiation Exposure Index (as provided by the fluoroscopic device): 37.0 mGy Kerma COMPARISON:  None Available. FINDINGS: Pharynx: Unremarkable. Esophagus: Normal appearance. No mucosal lesions or evidence of definitive stricture. Contrast passes easily through the expected region of the fundoplication. No evidence of contrast extravasation to suggest leak. Hiatal Hernia: None. Gastroesophageal reflux: None visualized. IMPRESSION: Limited water-soluble contrast esophagram finds no evidence of contrast extravasation to suggest postoperative leak. Contrast passes easily through the distal esophagus and into the stomach. Motility appears normal. Electronically Signed   By: Marin Roberts M.D.   On: 04/12/2023 09:16   DG Abd Portable 1V  Result Date: 04/10/2023 CLINICAL DATA:  [id number] Gastric outlet obstruction [id number] EXAM: PORTABLE ABDOMEN - 1 VIEW COMPARISON:  CT 04/08/2023 FINDINGS: Nasogastric tube and side port overlies the intra-abdominal portion of the stomach, persistent distension of a large paraesophageal gastric hernia is again noted. There is residual contrast material  within stomach. There is some contrast material noted within the right colon and hepatic flexure. IMPRESSION: Persistent distention of a large paraesophageal hernia. Passage of some contrast to the right colon and hepatic flexure since the prior exam, though with a significant residual amount of contrast within the intra-abdominal portion of the stomach. Findings suggest high-grade partial obstruction. Electronically Signed   By: Caprice Renshaw M.D.   On: 04/10/2023 11:59    Procedures Dr. Axel Filler (04/11/23) - XI ROBOTIC ASSISTED HIATAL HERNIA REPAIR WITH MESH AND TOUPET FUNDOPLICATION (N/A) INSERTION OF MESH (N/A)  Hospital Course:  Patient is a 47 year old female who presented to the ED with nausea and vomiting.  Workup showed hiatal hernia with gastric volvulus.  Patient was admitted and underwent procedure listed above.  Tolerated procedure well and was transferred to the floor.  Diet was advanced as tolerated to puree. Post-op esophogram without leak and easy passage of contrast through the distal esophagus. On POD1, the patient was voiding well, tolerating diet, ambulating well, pain well controlled, vital signs stable, incisions c/d/i and felt stable for discharge home.  Patient will follow up in our office in 3 weeks and knows to call with questions or concerns. She will call to confirm appointment date/time.    Physical Exam: General:  Alert, NAD, pleasant, comfortable Abd:  Soft, ND, mild tenderness, incisions C/D/I  I or a member of my team have reviewed this patient in the Controlled Substance Database.   Allergies as of 04/12/2023       Reactions   Penicillins Rash        Medication List     TAKE these medications    HYDROcodone-acetaminophen 7.5-325 mg/15 ml solution Commonly known as: HYCET Take 15 mLs by mouth every 6 (six) hours as needed for moderate pain.   pantoprazole sodium 40 mg Commonly known as: PROTONIX Take 40  mg by mouth daily.           Follow-up Information     Axel Filler, MD. Go on 05/03/2023.   Specialty: General Surgery Why: 9 AM for post-op follow up. Please arrive 30 min prior to appointment time to check in. Contact information: [address] [place] [address] [phone]                 Signed: Juliet Rude , [facility] 04/12/2023, 10:10 AM Please see Amion for pager number during day hours 7:00am-4:30pm

## 2023-04-12 NOTE — Progress Notes (Signed)
Discharge instructions given. Patient verbalized understanding and all questions were answered.  ?

## 2023-04-12 NOTE — Discharge Instructions (Signed)
EATING AFTER YOUR ESOPHAGEAL SURGERY °(Stomach Fundoplication, Hiatal Hernia repair, Achalasia surgery, etc) ° °###################################################################### ° °EAT °Start with a pureed / full liquid diet (see below) °Gradually transition to a high fiber diet with a fiber supplement over the next month after discharge.   ° °WALK °Walk an hour a day.  Control your pain to do that.   ° °CONTROL PAIN °Control pain so that you can walk, sleep, tolerate sneezing/coughing, go up/down stairs. ° °HAVE A BOWEL MOVEMENT DAILY °Keep your bowels regular to avoid problems.  OK to try a laxative to override constipation.  OK to use an antidairrheal to slow down diarrhea.  Call if not better after 2 tries ° °CALL IF YOU HAVE PROBLEMS/CONCERNS °Call if you are still struggling despite following these instructions. °Call if you have concerns not answered by these instructions ° °###################################################################### ° ° °After your esophageal surgery, expect some sticking with swallowing over the next 1-2 months.   ° °If food sticks when you eat, it is called "dysphagia".  This is due to swelling around your esophagus at the wrap & hiatal diaphragm repair.  It will gradually ease off over the next few months.  To help you through this temporary phase, we start you out on a pureed (blenderized) diet. ° °Your first meal in the hospital was thin liquids.  You should have been given a pureed diet by the time you left the hospital.  We ask patients to stay on a pureed diet for the first 2-3 weeks to avoid anything getting "stuck" near your recent surgery.  Don't be alarmed if your ability to swallow doesn't progress according to this plan.  Everyone is different and some diets can advance more or less quickly.   ° °It is often helpful to crush your medications or split them as they can sometimes stick, especially the first week or so. ° ° °Some BASIC RULES to follow are: °Maintain  an upright position whenever eating or drinking. °Take small bites - just a teaspoon size bite at a time. °Eat slowly.  It may also help to eat only one food at a time. °Consider nibbling through smaller, more frequent meals & avoid the urge to eat BIG meals °Do not push through feelings of fullness, nausea, or bloatedness °Do not mix solid foods and liquids in the same mouthful °Try not to "wash foods down" with large gulps of liquids. °Avoid carbonated (bubbly/fizzy) drinks.   °Avoid foods that make you feel gassy or bloated.  Start with bland foods first.  Wait on trying greasy, fried, or spicy meals until you are tolerating more bland solids well. °Understand that it will be hard to burp and belch at first.  This gradually improves with time.  Expect to be more gassy/flatulent/bloated initially.  Walking will help your body manage it better. °Consider using medications for bloating that contain simethicone such as  Maalox or Gas-X  °Consider crushing her medications, especially smaller pills.  The ability to swallow pills should get easier after a few weeks °Eat in a relaxed atmosphere & minimize distractions. °Avoid talking while eating.   °Do not use straws. °Following each meal, sit in an upright position (90 degree angle) for 60 to 90 minutes.  Going for a short walk can help as well °If food does stick, don't panic.  Try to relax and let the food pass on its own.  Sipping WARM LIQUID such as strong hot black tea can also help slide it down. ° ° °  Be gradual in changes & use common sense: ° °-If you easily tolerating a certain "level" of foods, advance to the next level gradually °-If you are having trouble swallowing a particular food, then avoid it.   °-If food is sticking when you advance your diet, go back to thinner previous diet (the lower LEVEL) for 1-2 days. ° °LEVEL 1 = PUREED DIET ° °Do for the first 2 WEEKS AFTER SURGERY ° °-Foods in this group are pureed or blenderized to a smooth, mashed  potato-like consistency.  °-If necessary, the pureed foods can keep their shape with the addition of a thickening agent.   °-Meat should be pureed to a smooth, pasty consistency.  Hot broth or gravy may be added to the pureed meat, approximately 1 oz. of liquid per 3 oz. serving of meat. °-CAUTION:  If any foods do not puree into a smooth consistency, swallowing will be more difficult.  (For example, nuts or seeds sometimes do not blend well.) ° °Hot Foods Cold Foods  °Pureed scrambled eggs and cheese Pureed cottage cheese  °Baby cereals Thickened juices and nectars  °Thinned cooked cereals (no lumps) Thickened milk or eggnog  °Pureed French toast or pancakes Ensure  °Mashed potatoes Ice cream  °Pureed parsley, au gratin, scalloped potatoes, candied sweet potatoes Fruit or Italian ice, sherbet  °Pureed buttered or alfredo noodles Plain yogurt  °Pureed vegetables (no corn or peas) Instant breakfast  °Pureed soups and creamed soups Smooth pudding, mousse, custard  °Pureed scalloped apples Whipped gelatin  °Gravies Sugar, syrup, honey, jelly  °Sauces, cheese, tomato, barbecue, white, creamed Cream  °Any baby food Creamer  °Alcohol in moderation (not beer or champagne) Margarine  °Coffee or tea Mayonnaise  ° Ketchup, mustard  ° Apple sauce  ° °SAMPLE MENU:  PUREED DIET °Breakfast Lunch Dinner  °Orange juice, 1/2 cup °Cream of wheat, 1/2 cup Pineapple juice, 1/2 cup Pureed turkey, barley soup, 3/4 cup °Pureed Hawaiian chicken, 3 oz  °Scrambled eggs, mashed or blended with cheese, 1/2 cup °Tea or coffee, 1 cup  °Whole milk, 1 cup  °Non-dairy creamer, 2 Tbsp. Mashed potatoes, 1/2 cup °Pureed cooled broccoli, 1/2 cup °Apple sauce, 1/2 cup °Coffee or tea Mashed potatoes, 1/2 cup °Pureed spinach, 1/2 cup °Frozen yogurt, 1/2 cup °Tea or coffee  ° ° ° ° °LEVEL 2 = SOFT DIET ° °After your first 2 weeks, you can advance to a soft diet.   °Keep on this diet until everything goes down easily. ° °Hot Foods Cold Foods  °White fish  Cottage cheese  °Stuffed fish Junior baby fruit  °Baby food meals Semi thickened juices  °Minced soft cooked, scrambled, poached eggs nectars  °Souffle & omelets Ripe mashed bananas  °Cooked cereals Canned fruit, pineapple sauce, milk  °potatoes Milkshake  °Buttered or Alfredo noodles Custard  °Cooked cooled vegetable Puddings, including tapioca  °Sherbet Yogurt  °Vegetable soup or alphabet soup Fruit ice, Italian ice  °Gravies Whipped gelatin  °Sugar, syrup, honey, jelly Junior baby desserts  °Sauces:  Cheese, creamed, barbecue, tomato, white Cream  °Coffee or tea Margarine  ° °SAMPLE MENU:  LEVEL 2 °Breakfast Lunch Dinner  °Orange juice, 1/2 cup °Oatmeal, 1/2 cup °Scrambled eggs with cheese, 1/2 cup °Decaffeinated tea, 1 cup °Whole milk, 1 cup °Non-dairy creamer, 2 Tbsp Pineapple juice, 1/2 cup °Minced beef, 3 oz °Gravy, 2 Tbsp °Mashed potatoes, 1/2 cup °Minced fresh broccoli, 1/2 cup °Applesauce, 1/2 cup °Coffee, 1 cup Turkey, barley soup, 3/4 cup °Minced Hawaiian chicken, 3 oz °  Mashed potatoes, 1/2 cup °Cooked spinach, 1/2 cup °Frozen yogurt, 1/2 cup °Non-dairy creamer, 2 Tbsp  ° ° ° ° °LEVEL 3 = CHOPPED DIET ° °-After all the foods in level 2 (soft diet) are passing through well you should advance up to more chopped foods.  °-It is still important to cut these foods into small pieces and eat slowly. ° °Hot Foods Cold Foods  °Poultry Cottage cheese  °Chopped Swedish meatballs Yogurt  °Meat salads (ground or flaked meat) Milk  °Flaked fish (tuna) Milkshakes  °Poached or scrambled eggs Soft, cold, dry cereal  °Souffles and omelets Fruit juices or nectars  °Cooked cereals Chopped canned fruit  °Chopped French toast or pancakes Canned fruit cocktail  °Noodles or pasta (no rice) Pudding, mousse, custard  °Cooked vegetables (no frozen peas, corn, or mixed vegetables) Green salad  °Canned small sweet peas Ice cream  °Creamed soup or vegetable soup Fruit ice, Italian ice  °Pureed vegetable soup or alphabet soup  Non-dairy creamer  °Ground scalloped apples Margarine  °Gravies Mayonnaise  °Sauces:  Cheese, creamed, barbecue, tomato, white Ketchup  °Coffee or tea Mustard  ° °SAMPLE MENU:  LEVEL 3 °Breakfast Lunch Dinner  °Orange juice, 1/2 cup °Oatmeal, 1/2 cup °Scrambled eggs with cheese, 1/2 cup °Decaffeinated tea, 1 cup °Whole milk, 1 cup °Non-dairy creamer, 2 Tbsp °Ketchup, 1 Tbsp °Margarine, 1 tsp °Salt, 1/4 tsp °Sugar, 2 tsp Pineapple juice, 1/2 cup °Ground beef, 3 oz °Gravy, 2 Tbsp °Mashed potatoes, 1/2 cup °Cooked spinach, 1/2 cup °Applesauce, 1/2 cup °Decaffeinated coffee °Whole milk °Non-dairy creamer, 2 Tbsp °Margarine, 1 tsp °Salt, 1/4 tsp Pureed turkey, barley soup, 3/4 cup °Barbecue chicken, 3 oz °Mashed potatoes, 1/2 cup °Ground fresh broccoli, 1/2 cup °Frozen yogurt, 1/2 cup °Decaffeinated tea, 1 cup °Non-dairy creamer, 2 Tbsp °Margarine, 1 tsp °Salt, 1/4 tsp °Sugar, 1 tsp  ° ° °LEVEL 4:  REGULAR FOODS ° °-Foods in this group are soft, moist, regularly textured foods.   °-This level includes meat and breads, which tend to be the hardest things to swallow.   °-Eat very slowly, chew well and continue to avoid carbonated drinks. °-most people are at this level in 4-6 weeks ° °Hot Foods Cold Foods  °Baked fish or skinned Soft cheeses - cottage cheese  °Souffles and omelets Cream cheese  °Eggs Yogurt  °Stuffed shells Milk  °Spaghetti with meat sauce Milkshakes  °Cooked cereal Cold dry cereals (no nuts, dried fruit, coconut)  °French toast or pancakes Crackers  °Buttered toast Fruit juices or nectars  °Noodles or pasta (no rice) Canned fruit  °Potatoes (all types) Ripe bananas  °Soft, cooked vegetables (no corn, lima, or baked beans) Peeled, ripe, fresh fruit  °Creamed soups or vegetable soup Cakes (no nuts, dried fruit, coconut)  °Canned chicken noodle soup Plain doughnuts  °Gravies Ice cream  °Bacon dressing Pudding, mousse, custard  °Sauces:  Cheese, creamed, barbecue, tomato, white Fruit ice, Italian ice, sherbet   °Decaffeinated tea or coffee Whipped gelatin  °Pork chops Regular gelatin  ° Canned fruited gelatin molds  ° Sugar, syrup, honey, jam, jelly  ° Cream  ° Non-dairy  ° Margarine  ° Oil  ° Mayonnaise  ° Ketchup  ° Mustard  ° °TROUBLESHOOTING IRREGULAR BOWELS  °1) Avoid extremes of bowel movements (no bad constipation/diarrhea)  °2) Miralax 17gm mixed in 8oz. water or juice-daily. May use BID as needed.  °3) Gas-x,Phazyme, etc. as needed for gas & bloating.  °4) Soft,bland diet. No spicy,greasy,fried foods.  °5) Prilosec over-the-counter   as needed  °6) May hold gluten/wheat products from diet to see if symptoms improve.  °7) May try probiotics (Align, Activa, etc) to help calm the bowels down  °7) If symptoms become worse call back immediately. ° ° ° °If you have any questions please call our office at [facility]: [phone]. ° °
